# Patient Record
Sex: Male | Born: 1950 | Race: White | Hispanic: No | Marital: Married | Smoking: Former smoker
Health system: Southern US, Community
[De-identification: ages and names within clinical notes are randomized; demographics above are authoritative.]

## PROBLEM LIST (undated history)

## (undated) DIAGNOSIS — I10 Essential (primary) hypertension: Secondary | ICD-10-CM

## (undated) HISTORY — PX: VASECTOMY: SHX75

## (undated) HISTORY — PX: BASAL CELL CARCINOMA EXCISION: SHX1214

---

## 2005-07-04 ENCOUNTER — Ambulatory Visit: Payer: Self-pay

## 2005-07-13 ENCOUNTER — Ambulatory Visit: Payer: Self-pay | Admitting: Ophthalmology

## 2006-10-03 ENCOUNTER — Ambulatory Visit: Payer: Self-pay | Admitting: Gastroenterology

## 2014-06-19 DIAGNOSIS — Z85828 Personal history of other malignant neoplasm of skin: Secondary | ICD-10-CM

## 2014-06-19 HISTORY — DX: Personal history of other malignant neoplasm of skin: Z85.828

## 2014-08-21 DIAGNOSIS — Z85828 Personal history of other malignant neoplasm of skin: Secondary | ICD-10-CM | POA: Insufficient documentation

## 2014-09-02 DIAGNOSIS — I10 Essential (primary) hypertension: Secondary | ICD-10-CM

## 2014-09-02 HISTORY — DX: Essential (primary) hypertension: I10

## 2016-08-14 ENCOUNTER — Ambulatory Visit: Payer: Medicare Other

## 2016-08-14 ENCOUNTER — Ambulatory Visit
Admission: EM | Admit: 2016-08-14 | Discharge: 2016-08-14 | Disposition: A | Discharge status: Home / Self Care | Payer: Medicare Other | Attending: Family Medicine | Admitting: Family Medicine

## 2016-08-14 DIAGNOSIS — I10 Essential (primary) hypertension: Secondary | ICD-10-CM | POA: Insufficient documentation

## 2016-08-14 DIAGNOSIS — B9689 Other specified bacterial agents as the cause of diseases classified elsewhere: Secondary | ICD-10-CM | POA: Diagnosis not present

## 2016-08-14 DIAGNOSIS — M7021 Olecranon bursitis, right elbow: Secondary | ICD-10-CM | POA: Diagnosis not present

## 2016-08-14 DIAGNOSIS — M7989 Other specified soft tissue disorders: Secondary | ICD-10-CM | POA: Diagnosis present

## 2016-08-14 DIAGNOSIS — M71121 Other infective bursitis, right elbow: Secondary | ICD-10-CM

## 2016-08-14 HISTORY — DX: Essential (primary) hypertension: I10

## 2016-08-14 MED ORDER — MELOXICAM 15 MG PO TABS: 15.0000 mg | ORAL_TABLET | Freq: Every day | ORAL | 0 refills | Status: DC

## 2016-08-14 MED ORDER — SULFAMETHOXAZOLE-TRIMETHOPRIM 800-160 MG PO TABS: 1.0000 | ORAL_TABLET | Freq: Two times a day (BID) | ORAL | 0 refills | Status: DC

## 2016-08-14 NOTE — ED Triage Notes (Signed)
Patient presents with a swollen right elbow, that started Thursday. It only hurts when he puts pressure on it.

## 2016-08-14 NOTE — ED Provider Notes (Signed)
CSN: [id number]     Arrival date & time 08/14/16  [id number] History   First MD Initiated Contact with Patient 08/14/16 [phone]     Chief Complaint  Patient presents with  . Joint Swelling    Right   (Consider location/radiation/quality/duration/timing/severity/associated sxs/prior Treatment) HPI  This a 65 year old male who presents with a swollen right elbow that began to bother him approximately 3 days ago. 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 He has had no injury to the elbow that he is aware of. He does relate that he did tear off for shingles off a rental home does not remember having any injury specifically to the elbow. The and taken ibuprofen 800 mg 2 doses and has noticed an improvement still concerned with the swelling and heat that he feels in the elbow. He has no difficulty with range of motion. All of his pain seems to be occurring with any pressure that he places on the elbow itself.      Past Medical History:  Diagnosis Date  . Hypertension    Past Surgical History:  Procedure Laterality Date  . BASAL CELL CARCINOMA EXCISION    . VASECTOMY     Family History  Problem Relation Age of Onset  . Healthy Mother   . COPD Father    Social History  Substance Use Topics  . Smoking status: Former Research scientist (life sciences)  . Smokeless tobacco: Never Used  . Alcohol use No    Review of Systems  Constitutional: Positive for activity change. Negative for chills, fatigue and fever.  Musculoskeletal: Positive for arthralgias and joint swelling.  All other systems reviewed and are negative.   Allergies  Review of patient's allergies indicates no known allergies.  Home Medications   Prior to Admission medications   Medication Sig Start Date End Date Taking? Authorizing Provider  meloxicam (MOBIC) 15 MG tablet Take 1 tablet (15 mg total) by mouth daily. 08/14/16   Lorin Picket, PA-C  sulfamethoxazole-trimethoprim (BACTRIM DS,SEPTRA DS) 800-160 MG  tablet Take 1 tablet by mouth 2 (two) times daily. 08/14/16   Lorin Picket, PA-C   Meds Ordered and Administered this Visit  Medications - No data to display  BP 108/67 (BP Location: Left Arm)   Pulse 75   Temp 98 F (36.7 C) (Oral)   Resp 18   SpO2 97%  No data found.   Physical Exam  Constitutional: He is oriented to person, place, and time. He appears well-developed and well-nourished. No distress.  HENT:  Head: Normocephalic and atraumatic.  Eyes: EOM are normal. Pupils are equal, round, and reactive to light.  Neck: Normal range of motion. Neck supple.  Musculoskeletal: Normal range of motion. He exhibits edema and tenderness. He exhibits no deformity.  Examination of the right elbow shows it to be mildly swollen. There is some mild warmth over the olecranon. Age of motion shows forward full range of motion to flexion and extension pronation and supination. Maximum tenderness is over the olecranon itself. There is no induration or fluctuance noted.  Neurological: He is alert and oriented to person, place, and time.  Skin: Skin is warm and dry. He is not diaphoretic. There is erythema.  Psychiatric: He has a normal mood and affect. His behavior is normal. Judgment and thought content normal.  Nursing note and vitals reviewed.   Urgent Care Course   Clinical Course    Procedures (including critical care time)  Labs Review Labs Reviewed -  No data to display  Imaging Review Dg Elbow Complete Right  Result Date: 08/14/2016 CLINICAL DATA:  Right elbow swelling EXAM: RIGHT ELBOW - COMPLETE 3+ VIEW COMPARISON:  None. FINDINGS: Four views of the right elbow submitted. No acute fracture or subluxation. No joint effusion. There is soft tissue swelling dorsal elbow and distal humeral region highly suspicious for cellulitis. Clinical correlation is necessary. IMPRESSION: No acute fracture or subluxation. No joint effusion. There is soft tissue swelling dorsal elbow and distal  humeral region highly suspicious for cellulitis. Clinical correlation is necessary. Electronically Signed   By: Lahoma Crocker M.D.   On: 08/14/2016 10:35     Visual Acuity Review  Right Eye Distance:   Left Eye Distance:   Bilateral Distance:    Right Eye Near:   Left Eye Near:    Bilateral Near:         MDM   1. Septic olecranon bursitis of right elbow    Medications - No data to display Plan: 1. Test/x-ray results and diagnosis reviewed with patient 2. rx as per orders; risks, benefits, potential side effects reviewed with patient 3. Recommend supportive treatment with Strict avoidance of pressure. Ibuprofen and start taking Mobic. Follow up in the clinic primary care if not improving 4. F/u prn if symptoms worsen or don't improve    Lorin Picket, PA-C 08/14/16 1102

## 2017-10-18 IMAGING — CR DG ELBOW COMPLETE 3+V*R*
4 series · 4 of 4 positions shown · non-contrast
Comparison: None.

CLINICAL DATA: Right elbow swelling

EXAM:
RIGHT ELBOW - COMPLETE 3+ VIEW

[elbow ap]
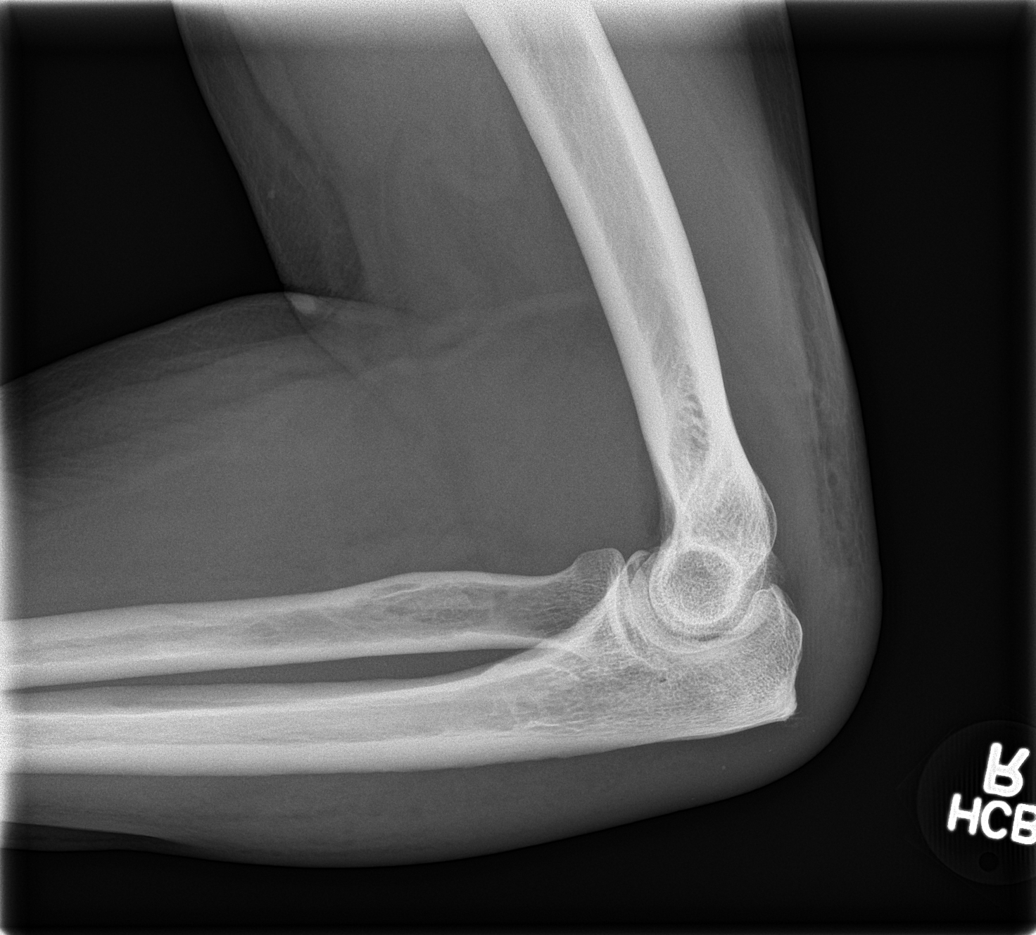

[elbow obl (1 of 2)]
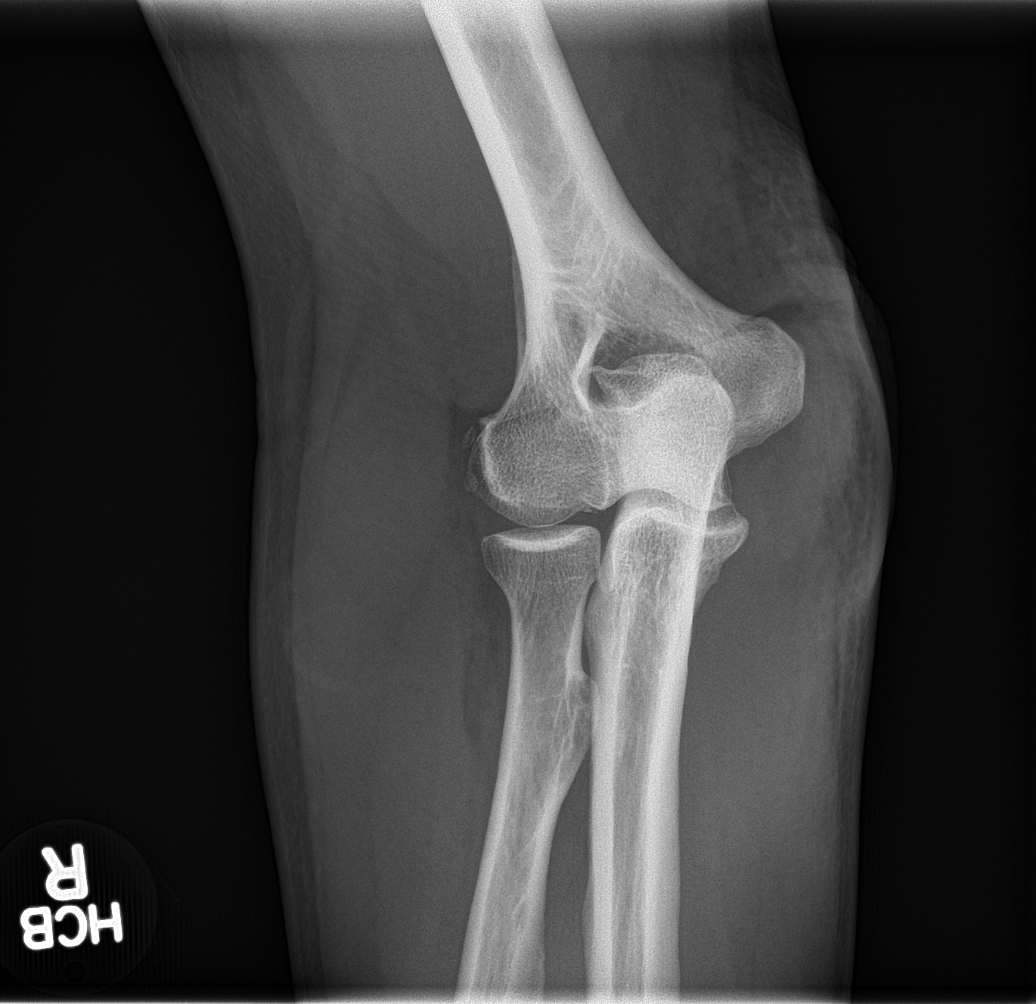

[elbow obl (2 of 2)]
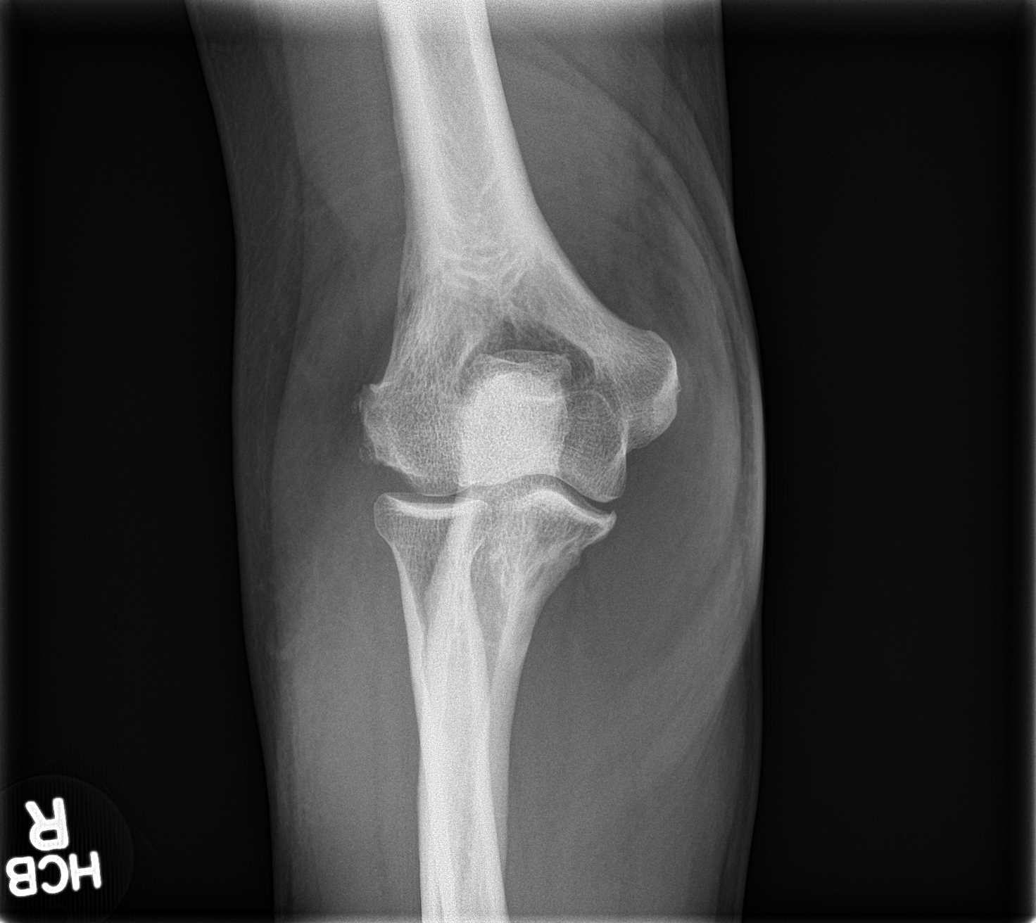

[elbow lat]
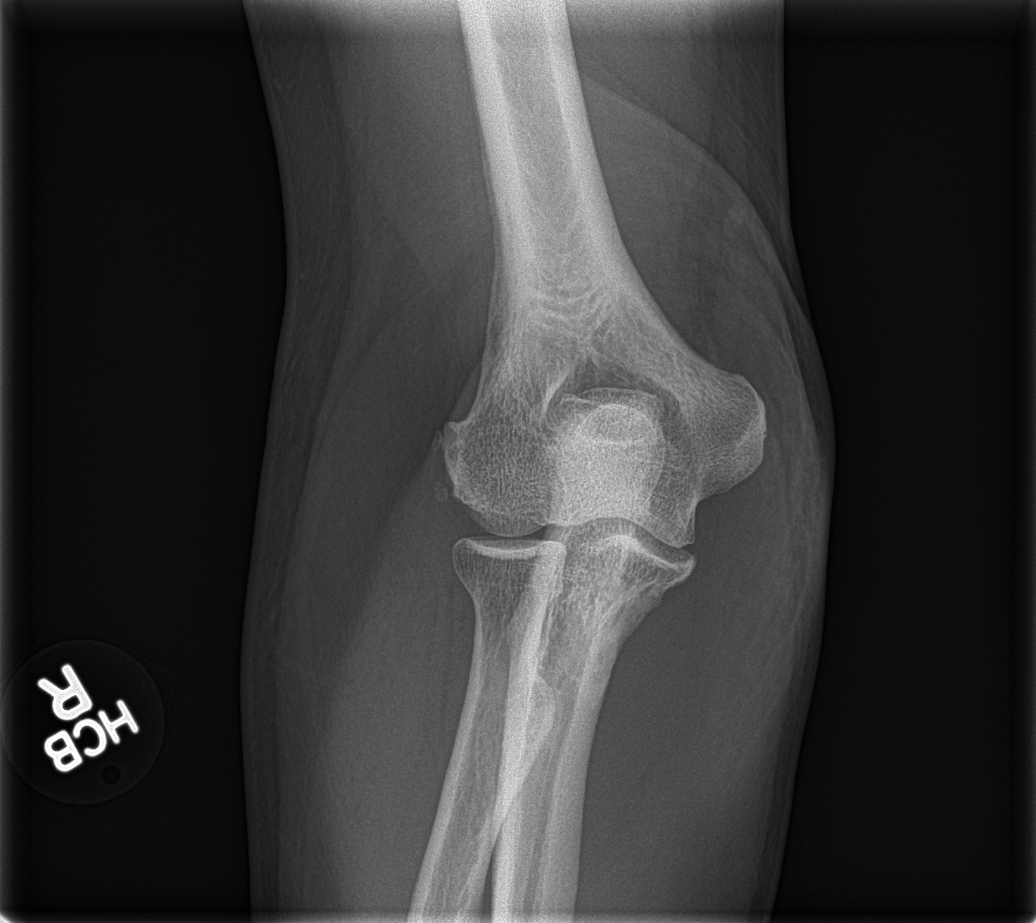

[4 of 4 positions shown; findings below may reference images not displayed]

FINDINGS: Four views of the right elbow submitted. No acute fracture or
subluxation. No joint effusion. There is soft tissue swelling dorsal
elbow and distal humeral region highly suspicious for cellulitis.
Clinical correlation is necessary.
IMPRESSION: No acute fracture or subluxation. No joint effusion. There is soft
tissue swelling dorsal elbow and distal humeral region highly
suspicious for cellulitis. Clinical correlation is necessary.

## 2017-10-19 DIAGNOSIS — R739 Hyperglycemia, unspecified: Secondary | ICD-10-CM | POA: Insufficient documentation

## 2017-10-19 HISTORY — DX: Hyperglycemia, unspecified: R73.9

## 2019-09-09 ENCOUNTER — Ambulatory Visit: Payer: BLUE CROSS/BLUE SHIELD

## 2019-09-23 ENCOUNTER — Other Ambulatory Visit: Payer: Self-pay | Admitting: *Deleted

## 2019-09-23 ENCOUNTER — Other Ambulatory Visit: Payer: Self-pay

## 2019-09-23 ENCOUNTER — Other Ambulatory Visit: Payer: Self-pay | Admitting: Urology

## 2019-09-23 DIAGNOSIS — Z125 Encounter for screening for malignant neoplasm of prostate: Secondary | ICD-10-CM

## 2019-09-23 NOTE — Progress Notes (Signed)
Patient: Darryl Mejia           Date of Birth: 05-16-1951           MRN: [id number] Visit Date: 09/23/2019 PCP: Patient, No Pcp Per     Prostate Exam Exam not completed.  Patient's History There are no active problems to display for this patient.  Past Medical History:  Diagnosis Date  . Hypertension     Family History  Problem Relation Age of Onset  . Healthy Mother   . COPD Father     Social History   Occupational History  . Not on file  Tobacco Use  . Smoking status: Former Research scientist (life sciences)  . Smokeless tobacco: Never Used  Substance and Sexual Activity  . Alcohol use: No  . Drug use: No  . Sexual activity: Not on file

## 2019-09-25 LAB — PSA: Prostate Specific Ag, Serum: 1.1 ng/mL (ref 0.0–4.0)

## 2019-10-02 ENCOUNTER — Telehealth (HOSPITAL_COMMUNITY): Payer: Self-pay | Admitting: *Deleted

## 2019-10-02 NOTE — Telephone Encounter (Signed)
Called patient and gave PSA result. Let him know it was 1.1 and normal for his age is below 3. Patient verbalized understanding.

## 2020-01-20 ENCOUNTER — Other Ambulatory Visit: Payer: Self-pay

## 2020-01-20 ENCOUNTER — Ambulatory Visit: Payer: Medicare Other | Attending: Internal Medicine

## 2020-01-20 DIAGNOSIS — Z23 Encounter for immunization: Secondary | ICD-10-CM | POA: Insufficient documentation

## 2020-01-20 NOTE — Progress Notes (Signed)
   Covid-19 Vaccination Clinic  Name:  Darryl Mejia    MRN: [id number] DOB: July 14, 1951  01/20/2020  [name] was observed post Covid-19 immunization for 15 minutes without incidence. He was provided with Vaccine Information Sheet and instruction to access the V-Safe system.   [name] was instructed to call 911 with any severe reactions post vaccine: Marland Kitchen Difficulty breathing  . Swelling of your face and throat  . A fast heartbeat  . A bad rash all over your body  . Dizziness and weakness    Immunizations Administered    Name Date Dose VIS Date Route   Pfizer COVID-19 Vaccine 01/20/2020  9:22 AM 0.3 mL 11/15/2019 Intramuscular   Manufacturer: [place]   Lot: [id number]   [place]: [id number]

## 2020-02-18 ENCOUNTER — Ambulatory Visit: Payer: Medicare Other | Attending: Internal Medicine

## 2020-02-18 DIAGNOSIS — Z23 Encounter for immunization: Secondary | ICD-10-CM

## 2020-02-18 NOTE — Progress Notes (Signed)
   Covid-19 Vaccination Clinic  Name:  Darryl Mejia    MRN: [id number] DOB: 1951/03/24  02/18/2020  [name] was observed post Covid-19 immunization for 15 minutes without incident. He was provided with Vaccine Information Sheet and instruction to access the V-Safe system.   [name] was instructed to call 911 with any severe reactions post vaccine: Marland Kitchen Difficulty breathing  . Swelling of face and throat  . A fast heartbeat  . A bad rash all over body  . Dizziness and weakness   Immunizations Administered    Name Date Dose VIS Date Route   Pfizer COVID-19 Vaccine 02/18/2020  8:49 AM 0.3 mL 11/15/2019 Intramuscular   Manufacturer: [place]   Lot: [id number]   [place]: [id number]

## 2020-03-18 ENCOUNTER — Other Ambulatory Visit: Payer: Self-pay

## 2020-03-18 ENCOUNTER — Encounter: Payer: Self-pay | Admitting: Dermatology

## 2020-03-18 ENCOUNTER — Ambulatory Visit (INDEPENDENT_AMBULATORY_CARE_PROVIDER_SITE_OTHER): Payer: Medicare Other | Admitting: Dermatology

## 2020-03-18 DIAGNOSIS — Z85828 Personal history of other malignant neoplasm of skin: Secondary | ICD-10-CM | POA: Diagnosis not present

## 2020-03-18 DIAGNOSIS — Z1283 Encounter for screening for malignant neoplasm of skin: Secondary | ICD-10-CM | POA: Diagnosis not present

## 2020-03-18 DIAGNOSIS — L578 Other skin changes due to chronic exposure to nonionizing radiation: Secondary | ICD-10-CM | POA: Diagnosis not present

## 2020-03-18 DIAGNOSIS — D229 Melanocytic nevi, unspecified: Secondary | ICD-10-CM

## 2020-03-18 DIAGNOSIS — L820 Inflamed seborrheic keratosis: Secondary | ICD-10-CM

## 2020-03-18 DIAGNOSIS — L821 Other seborrheic keratosis: Secondary | ICD-10-CM | POA: Diagnosis not present

## 2020-03-18 DIAGNOSIS — L570 Actinic keratosis: Secondary | ICD-10-CM

## 2020-03-18 NOTE — Progress Notes (Addendum)
Follow-Up Visit   Subjective  Darryl Mejia is a 69 y.o. male who presents for the following: check spot (R nose, present for >52yrs, no symptoms). He also has a spot on the chest that is growing.  He has also rough areas on the face of concern.  He would like other spots and moles checked.  The patient presents for upper body skin exam for skin cancer screening.  He has history of skin skin cancer of the right nose, basal cell carcinoma treated with Mohs surgery in the past.  The following portions of the chart were reviewed this encounter and updated as appropriate: Tobacco  Allergies  Meds  Problems  Med Hx  Surg Hx  Fam Hx      Review of Systems: No other skin or systemic complaints.  Objective  Well appearing patient in no apparent distress; mood and affect are within normal limits.  All skin waist up examined.  Objective  face (20): Pink scaly macules   Objective  R post medial shoulder, distal dorsum nose midline: Well healed scar with no evidence of recurrence.   Objective  R nasal bridge x 1, R nasal bridge lateral x 1, chest x 1 (3): Erythematous keratotic or waxy stuck-on papule or plaque.   Assessment & Plan   Actinic Damage - diffuse scaly erythematous macules with underlying dyspigmentation - Recommend daily broad spectrum sunscreen SPF 30+ to sun-exposed areas, reapply every 2 hours as needed.  - Call for new or changing lesions.  Skin cancer screening performed today.  0.2 Seborrheic Keratoses - Stuck-on, waxy, tan-brown papules and plaques  - Discussed benign etiology and prognosis. - Observe - Call for any changes  Melanocytic Nevi - Tan-brown and/or pink-flesh-colored symmetric macules and papules - Benign appearing on exam today - Observation - Call clinic for new or changing moles - Recommend daily use of broad spectrum spf 30+ sunscreen to sun-exposed areas.    AK (actinic keratosis) (20) face  Destruction of lesion -  face Complexity: simple   Destruction method: cryotherapy   Informed consent: discussed and consent obtained   Timeout:  patient name, date of birth, surgical site, and procedure verified Lesion destroyed using liquid nitrogen: Yes   Region frozen until ice ball extended beyond lesion: Yes   Outcome: patient tolerated procedure well with no complications   Post-procedure details: wound care instructions given    History of basal cell carcinoma (BCC) R post medial shoulder, distal dorsum nose midline  Clear. Observe for recurrence. Call clinic for new or changing lesions.  Recommend regular skin exams, daily broad-spectrum spf 30+ sunscreen use, and photoprotection.     Inflamed seborrheic keratosis (3) R nasal bridge x 1, R nasal bridge lateral x 1, chest x 1  Destruction of lesion - R nasal bridge x 1, R nasal bridge lateral x 1, chest x 1 Complexity: simple   Destruction method: cryotherapy   Informed consent: discussed and consent obtained   Timeout:  patient name, date of birth, surgical site, and procedure verified Lesion destroyed using liquid nitrogen: Yes   Region frozen until ice ball extended beyond lesion: Yes   Outcome: patient tolerated procedure well with no complications   Post-procedure details: wound care instructions given    Return in about 3 months (around 06/17/2020) for recheck ISKs R nasal bridge, R nasal bridge lateral.   I, Sonya Hupman, RMA, am acting as scribe for Sarina Ser, MD .  Documentation: I have reviewed the above documentation  for accuracy and completeness, and I agree with the above.  Sarina Ser, MD

## 2020-03-26 NOTE — Addendum Note (Signed)
Addended by: Nehemiah Massed, Hendy Brindle C on: 03/26/2020 01:00 PM   Modules accepted: Level of Service

## 2020-06-24 ENCOUNTER — Ambulatory Visit: Payer: BLUE CROSS/BLUE SHIELD | Admitting: Dermatology

## 2020-08-22 ENCOUNTER — Emergency Department (HOSPITAL_COMMUNITY)
Admission: EM | Admit: 2020-08-22 | Discharge: 2020-08-22 | Discharge status: Absent without leave | Payer: BLUE CROSS/BLUE SHIELD

## 2020-08-22 ENCOUNTER — Emergency Department
Admission: EM | Admit: 2020-08-22 | Discharge: 2020-08-22 | Disposition: A | Discharge status: Home / Self Care | Payer: Medicare Other | Attending: Emergency Medicine | Admitting: Emergency Medicine

## 2020-08-22 ENCOUNTER — Emergency Department: Payer: Medicare Other

## 2020-08-22 DIAGNOSIS — I10 Essential (primary) hypertension: Secondary | ICD-10-CM | POA: Diagnosis not present

## 2020-08-22 DIAGNOSIS — Z87891 Personal history of nicotine dependence: Secondary | ICD-10-CM | POA: Insufficient documentation

## 2020-08-22 DIAGNOSIS — Z79899 Other long term (current) drug therapy: Secondary | ICD-10-CM | POA: Insufficient documentation

## 2020-08-22 DIAGNOSIS — W5911XA Bitten by nonvenomous snake, initial encounter: Secondary | ICD-10-CM | POA: Diagnosis not present

## 2020-08-22 DIAGNOSIS — Z85828 Personal history of other malignant neoplasm of skin: Secondary | ICD-10-CM | POA: Insufficient documentation

## 2020-08-22 DIAGNOSIS — S61251A Open bite of left index finger without damage to nail, initial encounter: Secondary | ICD-10-CM | POA: Diagnosis present

## 2020-08-22 DIAGNOSIS — T63061A Toxic effect of venom of other North and South American snake, accidental (unintentional), initial encounter: Secondary | ICD-10-CM | POA: Diagnosis not present

## 2020-08-22 DIAGNOSIS — Y9389 Activity, other specified: Secondary | ICD-10-CM | POA: Diagnosis not present

## 2020-08-22 DIAGNOSIS — Z23 Encounter for immunization: Secondary | ICD-10-CM | POA: Insufficient documentation

## 2020-08-22 DIAGNOSIS — Y92017 Garden or yard in single-family (private) house as the place of occurrence of the external cause: Secondary | ICD-10-CM | POA: Insufficient documentation

## 2020-08-22 LAB — CBC WITH DIFFERENTIAL/PLATELET
Abs Immature Granulocytes: 0.03 10*3/uL (ref 0.00–0.07)
Basophils Absolute: 0.1 10*3/uL (ref 0.0–0.1)
Basophils Relative: 1 %
Eosinophils Absolute: 0.2 10*3/uL (ref 0.0–0.5)
Eosinophils Relative: 3 %
HCT: 39.9 % (ref 39.0–52.0)
Hemoglobin: 13.7 g/dL (ref 13.0–17.0)
Immature Granulocytes: 1 %
Lymphocytes Relative: 29 %
Lymphs Abs: 1.9 10*3/uL (ref 0.7–4.0)
MCH: 33.4 pg (ref 26.0–34.0)
MCHC: 34.3 g/dL (ref 30.0–36.0)
MCV: 97.3 fL (ref 80.0–100.0)
Monocytes Absolute: 0.6 10*3/uL (ref 0.1–1.0)
Monocytes Relative: 10 %
Neutro Abs: 3.8 10*3/uL (ref 1.7–7.7)
Neutrophils Relative %: 56 %
Platelets: 193 10*3/uL (ref 150–400)
RBC: 4.1 MIL/uL — ABNORMAL LOW (ref 4.22–5.81)
RDW: 12.2 % (ref 11.5–15.5)
WBC: 6.6 10*3/uL (ref 4.0–10.5)
nRBC: 0 % (ref 0.0–0.2)

## 2020-08-22 LAB — BASIC METABOLIC PANEL
Anion gap: 10 (ref 5–15)
BUN: 22 mg/dL (ref 8–23)
CO2: 25 mmol/L (ref 22–32)
Calcium: 9.2 mg/dL (ref 8.9–10.3)
Chloride: 104 mmol/L (ref 98–111)
Creatinine, Ser: 0.81 mg/dL (ref 0.61–1.24)
GFR calc Af Amer: >60 mL/min (ref >60)
GFR calc non Af Amer: >60 mL/min (ref >60)
Glucose, Bld: 128 mg/dL — ABNORMAL HIGH (ref 70–99)
Potassium: 3.9 mmol/L (ref 3.5–5.1)
Sodium: 139 mmol/L (ref 135–145)

## 2020-08-22 LAB — PROTIME-INR
INR: 1 (ref 0.8–1.2)
Prothrombin Time: 13.2 seconds (ref 11.4–15.2)

## 2020-08-22 LAB — FIBRINOGEN: Fibrinogen: 284 mg/dL (ref 210–475)

## 2020-08-22 MED ORDER — MORPHINE SULFATE (PF) 2 MG/ML IV SOLN: 2.0000 mg | Freq: Once | INTRAVENOUS | Status: DC

## 2020-08-22 MED ORDER — HYDROMORPHONE HCL 1 MG/ML IJ SOLN
0.5000 mg | Freq: Once | INTRAMUSCULAR | Status: AC
  Administered 2020-08-22: 0.5 mg via INTRAVENOUS
  Filled 2020-08-22: qty 1

## 2020-08-22 MED ORDER — TETANUS-DIPHTH-ACELL PERTUSSIS 5-2.5-18.5 LF-MCG/0.5 IM SUSP
0.5000 mL | Freq: Once | INTRAMUSCULAR | Status: AC
  Administered 2020-08-22: 0.5 mL via INTRAMUSCULAR
  Filled 2020-08-22: qty 0.5

## 2020-08-22 MED ORDER — ACETAMINOPHEN 500 MG PO TABS
1000.0000 mg | ORAL_TABLET | Freq: Once | ORAL | Status: AC
  Administered 2020-08-22: 1000 mg via ORAL
  Filled 2020-08-22: qty 2

## 2020-08-22 MED ORDER — SODIUM CHLORIDE 0.9 % IV SOLN: INTRAVENOUS | Status: DC

## 2020-08-22 NOTE — ED Notes (Signed)
Pt's left arm measured and mark with surgical tape and marker. Measurements are as follows:  Left Wrist 6 and 1/2 in Left Forearm 10 and 1/2in Left Bicep 11 and 1/2in

## 2020-08-22 NOTE — ED Notes (Addendum)
Swelling has extended from left index finger also to left knuckle. Pt denies pain. Arm remains straight and elevated. MD made aware

## 2020-08-22 NOTE — ED Notes (Addendum)
This RN on phone with poison control. Poison control recommendations: elevate extremity, keep arm straight, watch for pain not controlled with opioids and increased swelling even with elevation, observe at least 6hrs (when you do coags as well - PT/INR. Fibrinogen, platelets), recommend diliuadid over morphine for pain control, and consult poison control first if fasciotomy is needed.

## 2020-08-22 NOTE — ED Triage Notes (Signed)
Copperhead bite to the left index finger just PTA. Patient with swelling and bruising to same.

## 2020-08-22 NOTE — ED Provider Notes (Signed)
[facility] Emergency Department Provider Note  ____________________________________________   First MD Initiated Contact with Patient 08/22/20 1521     (approximate)  I have reviewed the triage vital signs and the nursing notes.   HISTORY  Chief Complaint Snake Bite    HPI Darryl Mejia is a 69 y.o. male , right hand dominant, was out his yeard today. Bent down to clear some brush and was bit by a copperhead on his left index finger, approx 45 min ago. Killed snake and knows it's a copperhead. Reports immediate onset of sharp, stabbing pain that has now spread to the thenar eminence of his thumb. Pain is only 1/10 currently.  he does feel like the swelling is advancing up his hand fairly frequently, though it is not blistering or painful. No numbness, tingling, paresthesias. No SOB. No chest pain. No other rash or areas of pain or swelling.     Past Medical History:  Diagnosis Date  . Hx of basal cell carcinoma 06/19/2014   Distal dorsum nose midline  . Hx of basal cell carcinoma 07/19/2018   R post medial shoulder  . Hypertension     There are no problems to display for this patient.   Past Surgical History:  Procedure Laterality Date  . BASAL CELL CARCINOMA EXCISION    . VASECTOMY      Prior to Admission medications   Medication Sig Start Date End Date Taking? Authorizing Provider  aspirin 81 MG EC tablet Take 81 mg by mouth daily.   Yes [provider]  atenolol (TENORMIN) 50 MG tablet Take 50 mg by mouth daily. 06/26/20  Yes [provider]    Allergies Patient has no known allergies.  Family History  Problem Relation Age of Onset  . Healthy Mother   . COPD Father     Social History Social History   Tobacco Use  . Smoking status: Former Research scientist (life sciences)  . Smokeless tobacco: Never Used  Substance Use Topics  . Alcohol use: No  . Drug use: No    Review of Systems  Review of Systems  Constitutional: Negative for  chills and fever.  HENT: Negative for sore throat.   Respiratory: Negative for shortness of breath.   Cardiovascular: Negative for chest pain.  Gastrointestinal: Negative for abdominal pain.  Genitourinary: Negative for flank pain.  Musculoskeletal: Negative for neck pain.  Skin: Positive for rash and wound.  Allergic/Immunologic: Negative for immunocompromised state.  Neurological: Negative for weakness and numbness.  Hematological: Does not bruise/bleed easily.  All other systems reviewed and are negative.    ____________________________________________   PHYSICAL EXAM:      VITAL SIGNS: ED Triage Vitals  Enc Vitals Group     BP 08/22/20 1516 (!) 145/86     Pulse Rate 08/22/20 1516 94     Resp 08/22/20 1516 18     Temp 08/22/20 1516 98.6 F (37 C)     Temp Source 08/22/20 1516 Oral     SpO2 08/22/20 1516 95 %     Weight 08/22/20 1518 181 lb (82.1 kg)     Height 08/22/20 1518 5\' 9"  (1.753 m)     Head Circumference --      Peak Flow --      Pain Score 08/22/20 1518 2     Pain Loc --      Pain Edu? --      Excl. in [place]? --      Physical Exam Vitals and  nursing note reviewed.  Constitutional:      General: He is not in acute distress.    Appearance: He is well-developed.  HENT:     Head: Normocephalic and atraumatic.  Eyes:     Conjunctiva/sclera: Conjunctivae normal.  Cardiovascular:     Rate and Rhythm: Normal rate and regular rhythm.     Heart sounds: Normal heart sounds.  Pulmonary:     Effort: Pulmonary effort is normal. No respiratory distress.     Breath sounds: No wheezing.  Abdominal:     General: There is no distension.  Musculoskeletal:     Cervical back: Neck supple.  Skin:    General: Skin is warm.     Capillary Refill: Capillary refill takes less than 2 seconds.     Findings: No rash.  Neurological:     Mental Status: He is alert and oriented to person, place, and time.     Motor: No abnormal muscle tone.      UPPER EXTREMITY EXAM:  LEFT  INSPECTION & PALPATION: Punctate single bite mark noted along volar aspect of distal phalanx of left index finger. There is moderate soft tissue edema w/o blistering or skin changes extending to the MCP, worse on dorsal aspect of hand. Passive ROM is intact and minimally painful.  SENSORY: Sensation is intact to light touch in:  Superficial radial nerve distribution (dorsal first web space) Median nerve distribution (tip of index finger)   Ulnar nerve distribution (tip of small finger)     MOTOR:  + Motor posterior interosseous nerve (thumb IP extension) + Anterior interosseous nerve (thumb IP flexion, index finger DIP flexion) + Radial nerve (wrist extension) + Median nerve (palpable firing thenar mass) + Ulnar nerve (palpable firing of first dorsal interosseous muscle)  VASCULAR: 2+ radial pulse Brisk capillary refill < 2 sec, fingers warm and well-perfused  COMPARTMENTS: Soft, warm, well-perfused No pain with passive extension No paresthesias     ____________________________________________   LABS (all labs ordered are listed, but only abnormal results are displayed)  Labs Reviewed  BASIC METABOLIC PANEL - Abnormal; Notable for the following components:      Result Value   Glucose, Bld 128 (*)    All other components within normal limits  CBC WITH DIFFERENTIAL/PLATELET - Abnormal; Notable for the following components:   RBC 4.10 (*)    All other components within normal limits  PROTIME-INR  FIBRINOGEN  CBC WITH DIFFERENTIAL/PLATELET  CBC WITH DIFFERENTIAL/PLATELET  PROTIME-INR  PROTIME-INR  FIBRINOGEN  FIBRINOGEN    ____________________________________________  EKG: None ________________________________________  RADIOLOGY All imaging, including plain films, CT scans, and ultrasounds, independently reviewed by me, and interpretations confirmed via formal radiology reads.  ED MD interpretation:   XR Finger left index: no acute  abnormality  Official radiology report(s): DG Finger Index Left  Result Date: 08/22/2020 CLINICAL DATA:  Snake bite EXAM: LEFT INDEX FINGER 2+V COMPARISON:  None. FINDINGS: There is no evidence of fracture or dislocation. There is no evidence of arthropathy or other focal bone abnormality. Diffuse soft tissue swelling seen around the digit. IMPRESSION: No acute osseous abnormality.  Diffuse soft tissue swelling. Electronically Signed   By: Prudencio Pair M.D.   On: 08/22/2020 16:41    ____________________________________________  PROCEDURES   Procedure(s) performed (including Critical Care):  Procedures  ____________________________________________  INITIAL IMPRESSION / MDM / [place] / ED COURSE  As part of my medical decision making, I reviewed the following data within the [place]  notes reviewed and incorporated, Old chart reviewed, Notes from prior ED visits, and [place] Controlled Substance Database       *Darryl Mejia was evaluated in Emergency Department on 08/22/2020 for the symptoms described in the history of present illness. He was evaluated in the context of the global COVID-19 pandemic, which necessitated consideration that the patient might be at risk for infection with the SARS-CoV-2 virus that causes COVID-19. Institutional protocols and algorithms that pertain to the evaluation of patients at risk for COVID-19 are in a state of rapid change based on information released by regulatory bodies including the CDC and federal and state organizations. These policies and algorithms were followed during the patient's care in the ED.  Some ED evaluations and interventions may be delayed as a result of limited staffing during the pandemic.*     Medical Decision Making:  69 yo M here with copperhead bite to left index finger. No retained FB. Labs reassuring with no signs of DIC or systemic involvement. Pt initially with mild to mod edema but this  regressed with elevation over a 6 hr obs period. <2 score on severity scale and pt does not meet criteria for antivenin, and pain is markedly well controlled. Will d/c with elevation, supportive care, good retur precautions.  ____________________________________________  FINAL CLINICAL IMPRESSION(S) / ED DIAGNOSES  Final diagnoses:  Snake bite, initial encounter     MEDICATIONS GIVEN DURING THIS VISIT:  Medications  0.9 %  sodium chloride infusion ( Intravenous New Bag/Given 08/22/20 1601)  Tdap (BOOSTRIX) injection 0.5 mL (0.5 mLs Intramuscular Given 08/22/20 1602)  acetaminophen (TYLENOL) tablet 1,000 mg (1,000 mg Oral Given 08/22/20 1624)  HYDROmorphone (DILAUDID) injection 0.5 mg (0.5 mg Intravenous Given 08/22/20 1625)     ED Discharge Orders    None       Note:  This document was prepared using Dragon voice recognition software and may include unintentional dictation errors.   Duffy Bruce, MD 08/22/20 2051

## 2020-08-22 NOTE — Discharge Instructions (Addendum)
Keep the finger/extremity elevated tonight  Take tylenol and/or advil as needed for pain

## 2020-08-22 NOTE — ED Notes (Signed)
Per poison control recommendations pt's arm elevated straight onto bedside table with pillow

## 2020-08-23 NOTE — ED Notes (Signed)
Poison control called to follow up. RN answered poison control questions.

## 2020-09-09 ENCOUNTER — Telehealth: Payer: Self-pay | Admitting: Nurse Practitioner

## 2020-09-09 DIAGNOSIS — U071 COVID-19: Secondary | ICD-10-CM

## 2020-09-09 NOTE — Telephone Encounter (Signed)
Called to discuss with Jamesetta Orleans about Covid symptoms and the use of regeneron, a monoclonal antibody infusion for those with mild to moderate Covid symptoms and at a high risk of hospitalization.     Pt is qualified for this infusion at the [place] infusion center due to co-morbid conditions and/or a member of an at-risk group, however declines infusion at this time. He is currently 10 days from symptom onset and feels well with mild lingering symptoms. Says he feels like he could play golf. Symptoms tier reviewed as well as criteria for ending isolation.  Symptoms reviewed that would warrant ED/Hospital evaluation. Preventative practices reviewed. Patient verbalized understanding. Patient advised to call back if he/she opts to proceed with infusion. Callback number provided. Urgent care and/or ER precautions given for severe symptoms. Last date eligible for infusion: 09/09/20. Also discussed booster recommendation, timing of booster, and antibodies from natural infection vs vaccination.    There are no problems to display for this patient.   Beckey Rutter, NP [phone] [email or web address]

## 2020-11-12 DIAGNOSIS — N138 Other obstructive and reflux uropathy: Secondary | ICD-10-CM

## 2020-11-12 HISTORY — DX: Other obstructive and reflux uropathy: N13.8

## 2020-12-07 ENCOUNTER — Ambulatory Visit: Payer: Medicare Other | Attending: Internal Medicine

## 2020-12-07 DIAGNOSIS — Z23 Encounter for immunization: Secondary | ICD-10-CM

## 2020-12-07 NOTE — Progress Notes (Signed)
   Covid-19 Vaccination Clinic  Name:  Darryl Mejia    MRN: [id number] DOB: [birth year]  12/07/2020  Mr. Darryl Mejia was observed post Covid-19 immunization for 15 minutes without incident. He was provided with Vaccine Information Sheet and instruction to access the V-Safe system.   Mr. Darryl Mejia was instructed to call 911 with any severe reactions post vaccine: Marland Kitchen Difficulty breathing  . Swelling of face and throat  . A fast heartbeat  . A bad rash all over body  . Dizziness and weakness   Immunizations Administered    Name Date Dose VIS Date Route   Pfizer COVID-19 Vaccine 12/07/2020  1:49 PM 0.3 mL 09/23/2020 Intramuscular   Manufacturer: [employer], [employer]   Lot: [id number]   [place]: [address]-3

## 2021-03-11 ENCOUNTER — Ambulatory Visit: Payer: BLUE CROSS/BLUE SHIELD

## 2021-10-26 IMAGING — DX DG FINGER INDEX 2+V*L*
3 series · 3 of 3 positions shown · non-contrast
Comparison: None.

CLINICAL DATA: Snake bite

EXAM:
LEFT INDEX FINGER 2+V

[finger ap]
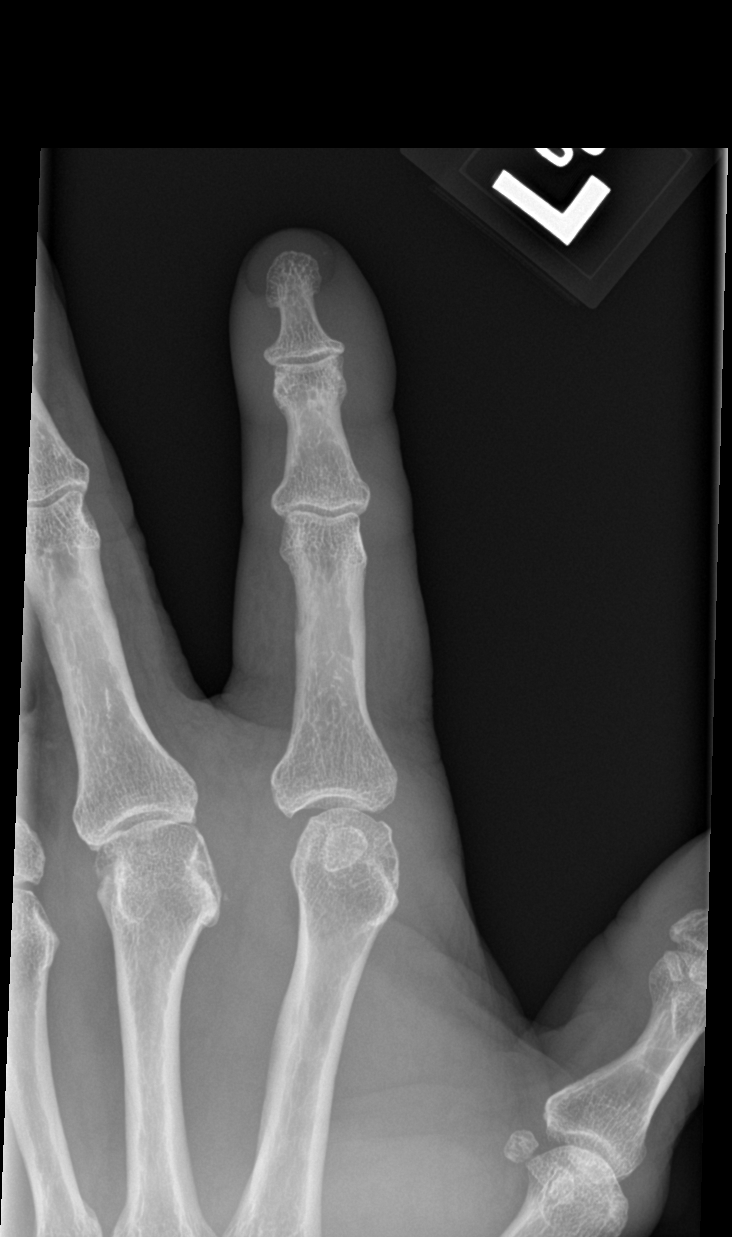

[finger obl]
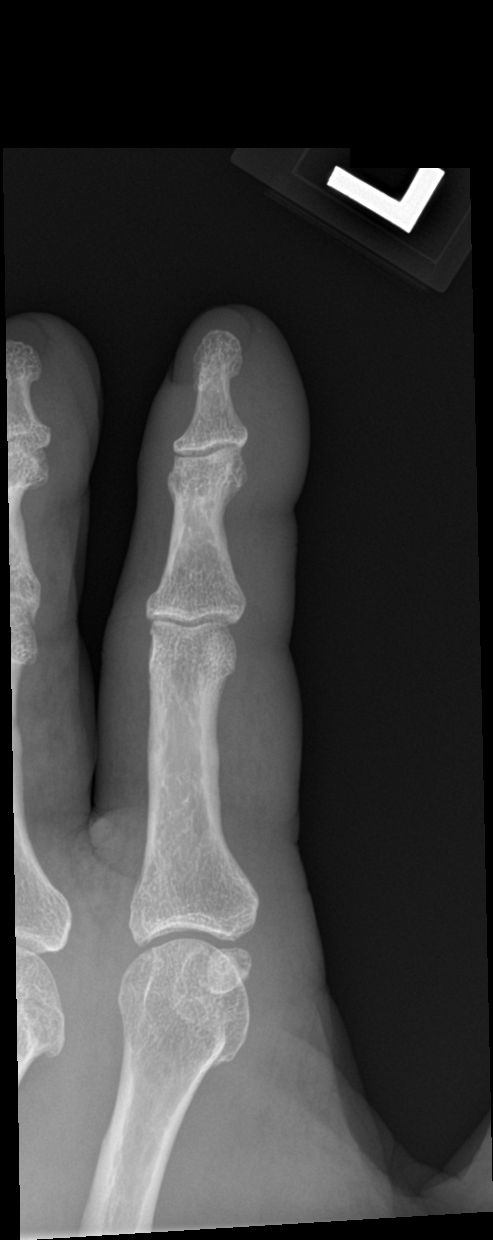

[finger lat]
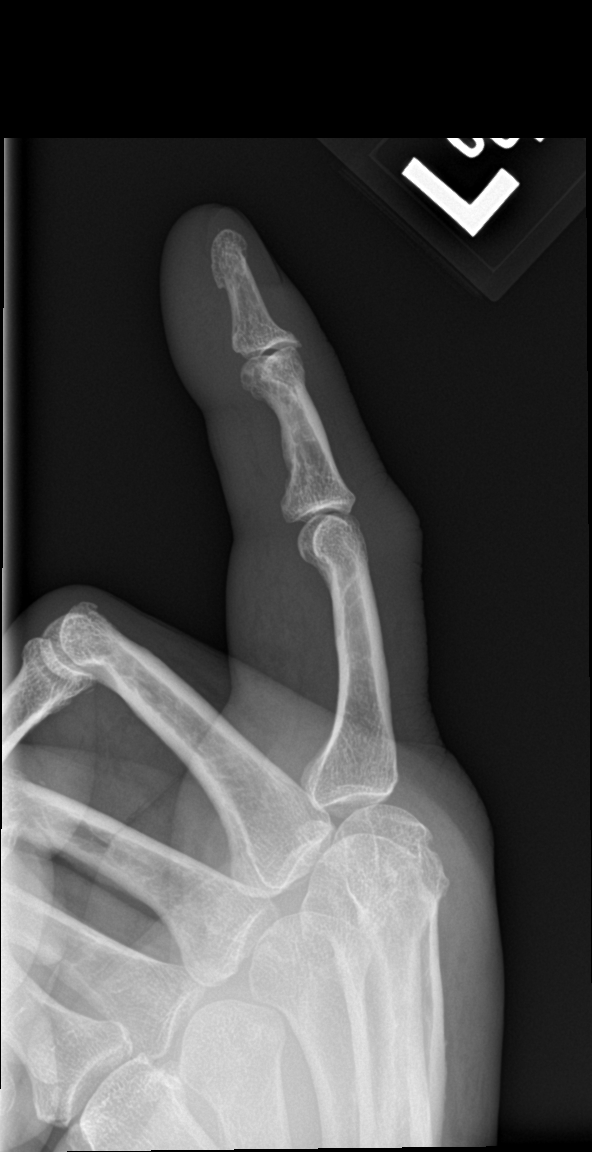

[3 of 3 positions shown; findings below may reference images not displayed]

FINDINGS: There is no evidence of fracture or dislocation. There is no
evidence of arthropathy or other focal bone abnormality. Diffuse
soft tissue swelling seen around the digit.
IMPRESSION: No acute osseous abnormality.  Diffuse soft tissue swelling.

## 2022-03-03 ENCOUNTER — Ambulatory Visit (INDEPENDENT_AMBULATORY_CARE_PROVIDER_SITE_OTHER): Payer: Medicare Other | Admitting: Urology

## 2022-03-03 ENCOUNTER — Encounter: Payer: Self-pay | Admitting: Urology

## 2022-03-03 VITALS — BP 136/81 | HR 76 | Ht 69.0 in | Wt 190.0 lb

## 2022-03-03 DIAGNOSIS — N401 Enlarged prostate with lower urinary tract symptoms: Secondary | ICD-10-CM | POA: Diagnosis not present

## 2022-03-03 DIAGNOSIS — N390 Urinary tract infection, site not specified: Secondary | ICD-10-CM

## 2022-03-03 DIAGNOSIS — N410 Acute prostatitis: Secondary | ICD-10-CM | POA: Diagnosis not present

## 2022-03-03 DIAGNOSIS — N138 Other obstructive and reflux uropathy: Secondary | ICD-10-CM

## 2022-03-03 LAB — MICROSCOPIC EXAMINATION
Bacteria, UA: NONE SEEN
Epithelial Cells (non renal): NONE SEEN /hpf (ref 0–10)
RBC, Urine: NONE SEEN /hpf (ref 0–2)

## 2022-03-03 LAB — URINALYSIS, COMPLETE
Bilirubin, UA: NEGATIVE
Glucose, UA: NEGATIVE
Ketones, UA: NEGATIVE
Leukocytes,UA: NEGATIVE
Nitrite, UA: NEGATIVE
Protein,UA: NEGATIVE
RBC, UA: NEGATIVE
Specific Gravity, UA: 1.025 (ref 1.005–1.030)
Urobilinogen, Ur: 0.2 mg/dL (ref 0.2–1.0)
pH, UA: 5.5 (ref 5.0–7.5)

## 2022-03-03 LAB — BLADDER SCAN AMB NON-IMAGING

## 2022-03-03 MED ORDER — CEFDINIR 300 MG PO CAPS: 300.0000 mg | ORAL_CAPSULE | Freq: Two times a day (BID) | ORAL | 0 refills | Status: DC

## 2022-03-03 MED ORDER — TAMSULOSIN HCL 0.4 MG PO CAPS: 0.4000 mg | ORAL_CAPSULE | Freq: Every day | ORAL | 11 refills | Status: DC

## 2022-03-03 NOTE — Patient Instructions (Addendum)
Cystoscopy ?Cystoscopy is a procedure that is used to help diagnose and sometimes treat conditions that affect the lower urinary tract. The lower urinary tract includes the bladder and the urethra. The urethra is the tube that drains urine from the bladder. Cystoscopy is done using a thin, tube-shaped instrument with a light and camera at the end (cystoscope). The cystoscope may be hard or flexible, depending on the goal of the procedure. The cystoscope is inserted through the urethra, into the bladder. ?Cystoscopy may be recommended if you have: ?Urinary tract infections that keep coming back. ?Blood in the urine (hematuria). ?An inability to control when you urinate (urinary incontinence) or an overactive bladder. ?Unusual cells found in a urine sample. ?A blockage in the urethra, such as a urinary stone. ?Painful urination. ?An abnormality in the bladder found during an intravenous pyelogram (IVP) or CT scan. ?What are the risks? ?Generally, this is a safe procedure. However, problems may occur, including: ?Infection. ?Bleeding. ? ?What happens during the procedure? ? ?You will be given one or more of the following: ?A medicine to numb the area (local anesthetic). ?The area around the opening of your urethra will be cleaned. ?The cystoscope will be passed through your urethra into your bladder. ?Germ-free (sterile) fluid will flow through the cystoscope to fill your bladder. The fluid will stretch your bladder so that your health care provider can clearly examine your bladder walls. ?Your doctor will look at the urethra and bladder. ?The cystoscope will be removed ?The procedure may vary among health care providers  ?What can I expect after the procedure? ?After the procedure, it is common to have: ?Some soreness or pain in your urethra. ?Urinary symptoms. These include: ?Mild pain or burning when you urinate. Pain should stop within a few minutes after you urinate. This may last for up to a few days after the  procedure. ?A small amount of blood in your urine for several days. ?Feeling like you need to urinate but producing only a small amount of urine. ?Follow these instructions at home: ?General instructions ?Return to your normal activities as told by your health care provider.  ?Drink plenty of fluids after the procedure. ?Keep all follow-up visits as told by your health care provider. This is important. ?Contact a health care provider if you: ?Have pain that gets worse or does not get better with medicine, especially pain when you urinate lasting longer than 72 hours after the procedure. ?Have trouble urinating. ?Get help right away if you: ?Have blood clots in your urine. ?Have a fever or chills. ?Are unable to urinate. ?Summary ?Cystoscopy is a procedure that is used to help diagnose and sometimes treat conditions that affect the lower urinary tract. ?Cystoscopy is done using a thin, tube-shaped instrument with a light and camera at the end. ?After the procedure, it is common to have some soreness or pain in your urethra. ?It is normal to have blood in your urine after the procedure.  ?If you were prescribed an antibiotic medicine, take it as told by your health care provider.  ?This information is not intended to replace advice given to you by your health care provider. Make sure you discuss any questions you have with your health care provider. ?Document Revised: 11/13/2018 Document Reviewed: 11/13/2018 ?Elsevier Patient Education ? [place]. ? ? ?Benign Prostatic Hyperplasia ?Benign prostatic hyperplasia (BPH) is an enlarged prostate gland that is caused by the normal aging process. The prostate may get bigger as a man gets older. The  condition is not caused by cancer. The prostate is a walnut-sized gland that is involved in the production of semen. It is located in front of the rectum and below the bladder. The bladder stores urine. The urethra carries stored urine out of the body. ?An enlarged  prostate can press on the urethra. This can make it harder to pass urine. The buildup of urine in the bladder can cause infection. Back pressure and infection may progress to bladder damage and kidney (renal) failure. ?What are the causes? ?This condition is part of the normal aging process. However, not all men develop problems from this condition. If the prostate enlarges away from the urethra, urine flow will not be blocked. If it enlarges toward the urethra and compresses it, there will be problems passing urine. ?What increases the risk? ?This condition is more likely to develop in men older than 50 years. ?What are the signs or symptoms? ?Symptoms of this condition include: ?Getting up often during the night to urinate. ?Needing to urinate frequently during the day. ?Difficulty starting urine flow. ?Decrease in size and strength of your urine stream. ?Leaking (dribbling) after urinating. ?Inability to pass urine. This needs immediate treatment. ?Inability to completely empty your bladder. ?Pain when you pass urine. This is more common if there is also an infection. ?Urinary tract infection (UTI). ?How is this diagnosed? ?This condition is diagnosed based on your medical history, a physical exam, and your symptoms. Tests will also be done, such as: ?A post-void bladder scan. This measures any amount of urine that may remain in your bladder after you finish urinating. ?A digital rectal exam. In a rectal exam, your health care provider checks your prostate by putting a lubricated, gloved finger into your rectum to feel the back of your prostate gland. This exam detects the size of your gland and any abnormal lumps or growths. ?An exam of your urine (urinalysis). ?A prostate specific antigen (PSA) screening. This is a blood test used to screen for prostate cancer. ?An ultrasound. This test uses sound waves to electronically produce a picture of your prostate gland. ?Your health care provider may refer you to a  specialist in kidney and prostate diseases (urologist). ?How is this treated? ?Once symptoms begin, your health care provider will monitor your condition (active surveillance or watchful waiting). Treatment for this condition will depend on the severity of your condition. Treatment may include: ?Observation and yearly exams. This may be the only treatment needed if your condition and symptoms are mild. ?Medicines to relieve your symptoms, including: ?Medicines to shrink the prostate. ?Medicines to relax the muscle of the prostate. ?Surgery in severe cases. Surgery may include: ?Prostatectomy. In this procedure, the prostate tissue is removed completely through an open incision or with a laparoscope or robotics. ?Transurethral resection of the prostate (TURP). In this procedure, a tool is inserted through the opening at the tip of the penis (urethra). It is used to cut away tissue of the inner core of the prostate. The pieces are removed through the same opening of the penis. This removes the blockage. ?Transurethral incision (TUIP). In this procedure, small cuts are made in the prostate. This lessens the prostate's pressure on the urethra. ?Transurethral microwave thermotherapy (TUMT). This procedure uses microwaves to create heat. The heat destroys and removes a small amount of prostate tissue. ?Transurethral needle ablation (TUNA). This procedure uses radio frequencies to destroy and remove a small amount of prostate tissue. ?Interstitial laser coagulation ([place]). This procedure uses a laser to  destroy and remove a small amount of prostate tissue. ?Transurethral electrovaporization (TUVP). This procedure uses electrodes to destroy and remove a small amount of prostate tissue. ?Prostatic urethral lift. This procedure inserts an implant to push the lobes of the prostate away from the urethra. ?Follow these instructions at home: ?Take over-the-counter and prescription medicines only as told by your health care  provider. ?Monitor your symptoms for any changes. Contact your health care provider with any changes. ?Avoid drinking large amounts of liquid before going to bed or out in public. ?Avoid or reduce how much caffeine or a

## 2022-03-03 NOTE — Progress Notes (Signed)
? ?  03/03/22 ?4:31 PM  ? ?Darryl Mejia ?1951/05/03 ?[id number] ? ?CC: Recurrent UTIs, BPH and incomplete bladder emptying ? ?HPI: ?I saw Darryl Mejia today for the above issues.  He is a 71 year old male who developed a UTI in November 2022 and was treated with a 1 week course of antibiotics with resolution of his symptoms.  He has had multiple culture documented recurrent Staph aureus UTIs since that time, and each time he has discontinued antibiotics when he feels his symptoms improved, never completed a complete course.  He was also evaluated by Dr. Eliberto Ivory locally and PVR was significantly elevated at 400 mL and he was started on Flomax.  He apparently had a IVP there as well that was reportedly normal.  DRE was normal with Dr. Eliberto Ivory showing a 30 g gland, and PSA was normal in December 2022 at 0.78. ? ?He reports some increased urinary frequency and some weak stream, but this has improved somewhat on Flomax.  PVR today remains elevated at 209 mL.  He is currently on a course of cefdinir for most recent UTI, as he discontinued the Levaquin secondary to joint pain. ? ? ?PMH: ?Past Medical History:  ?Diagnosis Date  ? Hx of basal cell carcinoma 06/19/2014  ? Distal dorsum nose midline  ? Hx of basal cell carcinoma 07/19/2018  ? R post medial shoulder  ? Hypertension   ? ? ?Surgical History: ?Past Surgical History:  ?Procedure Laterality Date  ? BASAL CELL CARCINOMA EXCISION    ? VASECTOMY    ? ? ?Family History: ?Family History  ?Problem Relation Age of Onset  ? Healthy Mother   ? COPD Father   ? ? ?Social History:  reports that he has quit smoking. He has been exposed to tobacco smoke. He has never used smokeless tobacco. He reports that he does not drink alcohol and does not use drugs. ? ?Physical Exam: ?BP 136/81   Pulse 76   Ht '5\' 9"'$  (1.753 m)   Wt 190 lb (86.2 kg)   BMI 28.06 kg/m?   ? ?Constitutional:  Alert and oriented, No acute distress. ?Cardiovascular: No clubbing, cyanosis, or edema. ?Respiratory: Normal  respiratory effort, no increased work of breathing. ?GI: Abdomen is soft, nontender, nondistended, no abdominal masses ? ?Laboratory Data: ?Reviewed, see HPI ? ?Pertinent Imaging: ?Unable to review, but IVP with Dr. Eliberto Ivory reportedly normal ? 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  First, I recommended completing a full 3-week course of the cefdinir for his suspected prostatitis which may improve his recurrent infections.  I also recommended a cystoscopy for further evaluation of urethral stricture or BPH as well as any bladder lesions.  He likely would benefit from an outlet procedure with his recurrent infections and incomplete bladder emptying. ? ?Cefdinir extended to 3-week course total for prostatitis ?RTC 1 month for cystoscopy ? ?Nickolas Madrid, MD ?03/03/2022 ? ?[facility] [place] ?[address] ?[place], [place] [address] ?[phone] ? ? ?

## 2022-03-31 ENCOUNTER — Ambulatory Visit (INDEPENDENT_AMBULATORY_CARE_PROVIDER_SITE_OTHER): Payer: Medicare Other | Admitting: Urology

## 2022-03-31 ENCOUNTER — Encounter: Payer: Self-pay | Admitting: Urology

## 2022-03-31 VITALS — BP 121/82 | HR 51 | Ht 69.0 in | Wt 187.0 lb

## 2022-03-31 DIAGNOSIS — R3914 Feeling of incomplete bladder emptying: Secondary | ICD-10-CM

## 2022-03-31 DIAGNOSIS — N138 Other obstructive and reflux uropathy: Secondary | ICD-10-CM

## 2022-03-31 DIAGNOSIS — N401 Enlarged prostate with lower urinary tract symptoms: Secondary | ICD-10-CM | POA: Diagnosis not present

## 2022-03-31 DIAGNOSIS — R3912 Poor urinary stream: Secondary | ICD-10-CM | POA: Diagnosis not present

## 2022-03-31 DIAGNOSIS — Z87440 Personal history of urinary (tract) infections: Secondary | ICD-10-CM

## 2022-03-31 DIAGNOSIS — R350 Frequency of micturition: Secondary | ICD-10-CM

## 2022-03-31 DIAGNOSIS — N390 Urinary tract infection, site not specified: Secondary | ICD-10-CM

## 2022-03-31 DIAGNOSIS — R351 Nocturia: Secondary | ICD-10-CM

## 2022-03-31 MED ORDER — SULFAMETHOXAZOLE-TRIMETHOPRIM 800-160 MG PO TABS
1.0000 | ORAL_TABLET | Freq: Once | ORAL | Status: AC
  Administered 2022-03-31: 1 via ORAL

## 2022-03-31 NOTE — Addendum Note (Signed)
Addended by: Despina Hidden on: 03/31/2022 03:57 PM ? ? Modules accepted: Orders ? ?

## 2022-03-31 NOTE — Patient Instructions (Signed)

## 2022-03-31 NOTE — Progress Notes (Signed)
Cystoscopy Procedure Note: ? ?Indication: BPH, incomplete bladder emptying, recurrent UTI ? ?Bactrim given for prophylaxis, UA completely benign today ? ?Primary urinary symptoms are frequency and some weak stream, nocturia 1 time per night ? ?After informed consent and discussion of the procedure and its risks, Darryl Mejia was positioned and prepped in the standard fashion. Cystoscopy was performed with a flexible cystoscope. The urethra, bladder neck and entire bladder was visualized in a standard fashion. The prostate was moderate in size with lateral lobe hypertrophy. The ureteral orifices were visualized in their normal location and orientation.  Small bladder diverticula, no suspicious lesions, no abnormalities on retroflexion. ? ?Findings: ?Moderate prostate enlargement, no suspicious lesions, no stricture ? ?Assessment and Plan: ?He would like to continue Flomax for his BPH.  We discussed return precautions extensively including worsening urinary symptoms, recurrent UTIs, or urinary retention.  He would be a good candidate for UroLift in the future if worsening symptoms and he opts for intervention ? ?RTC 6 months PVR, sooner if problems ? ?Nickolas Madrid, MD ?03/31/2022  ? ?

## 2022-04-01 LAB — URINALYSIS, COMPLETE
Bilirubin, UA: NEGATIVE
Glucose, UA: NEGATIVE
Ketones, UA: NEGATIVE
Leukocytes,UA: NEGATIVE
Nitrite, UA: NEGATIVE
Protein,UA: NEGATIVE
Specific Gravity, UA: 1.02 (ref 1.005–1.030)
Urobilinogen, Ur: 0.2 mg/dL (ref 0.2–1.0)
pH, UA: 5.5 (ref 5.0–7.5)

## 2022-04-01 LAB — MICROSCOPIC EXAMINATION
Bacteria, UA: NONE SEEN
RBC, Urine: NONE SEEN /hpf (ref 0–2)

## 2022-05-19 DIAGNOSIS — R7303 Prediabetes: Secondary | ICD-10-CM

## 2022-05-19 HISTORY — DX: Prediabetes: R73.03

## 2022-09-29 ENCOUNTER — Ambulatory Visit: Payer: Medicare Other | Admitting: Urology

## 2022-10-04 ENCOUNTER — Ambulatory Visit (INDEPENDENT_AMBULATORY_CARE_PROVIDER_SITE_OTHER): Payer: Medicare Other | Admitting: Urology

## 2022-10-04 ENCOUNTER — Encounter: Payer: Self-pay | Admitting: Urology

## 2022-10-04 VITALS — BP 132/83 | HR 70 | Ht 69.0 in | Wt 189.0 lb

## 2022-10-04 DIAGNOSIS — N390 Urinary tract infection, site not specified: Secondary | ICD-10-CM | POA: Diagnosis not present

## 2022-10-04 DIAGNOSIS — N401 Enlarged prostate with lower urinary tract symptoms: Secondary | ICD-10-CM | POA: Diagnosis not present

## 2022-10-04 DIAGNOSIS — N138 Other obstructive and reflux uropathy: Secondary | ICD-10-CM

## 2022-10-04 LAB — BLADDER SCAN AMB NON-IMAGING

## 2022-10-04 MED ORDER — TAMSULOSIN HCL 0.4 MG PO CAPS: 0.4000 mg | ORAL_CAPSULE | Freq: Every day | ORAL | 11 refills | Status: DC

## 2022-10-04 NOTE — Progress Notes (Signed)
   10/04/2022 9:57 AM   Jamesetta Orleans Aug 21, 1951 [id number]  Reason for visit: Follow up BPH, recurrent UTI  HPI: 71 year old male with incomplete bladder emptying and recurrent UTI.  He was not completing the full prescribed course of antibiotics, and so this was more consistent with a persistent UTI compared to true recurrent infections.  He underwent a cystoscopy in April 2023 that showed a moderate size prostate with lateral lobe hypertrophy, small bladder diverticula, but no suspicious lesions.  Primary urinary symptoms were frequency, weak stream, nocturia 1 time per night.  Symptoms have improved significantly after starting Flomax, and PVRs have decreased, PVR normal today at 100 mL.  He really denies any urinary complaints today.  We discussed return precautions of gross hematuria, worsening urinary symptoms, recurrent urinary infections, and I recommended continuing yearly follow-up for PVR.  PSA was normal in December 2022 at 0.78, no further screening needed per guideline recommendations.  Flomax refilled, RTC 1 year PVR  Billey Co, MD  [facility] [address], [place], [place] [address] [phone]

## 2022-12-06 NOTE — Progress Notes (Unsigned)
12/07/2022 8:51 PM   Darryl Mejia 1951/08/20 [id number]  Referring provider: No referring provider defined for this encounter.  Urological history: 1. rUTI's -contributing factors of age, BPH w/ incomplete bladder emptying, pre-diabetic (5.9) -cysto (03/2022) - no nidus for infection -documented urine culture over the last year  02/24/2022 - staphylococcus aureus  12/13/2021 - staphylococcus aureus    2. BPH w/ incomplete bladder emptying -PSA (10/2022) 0.50 -cysto (03/2022) - moderate in size with lateral lobe hypertrophy - small bladder diverticula  -PVR *** -tamsulosin 0.5 mg daily   No chief complaint on file.   HPI: Darryl Mejia is a 72 y.o. male who presents today for lower abdominal pain and prostate issues.   I PSS *** UA *** PVR ***   PMH: Past Medical History:  Diagnosis Date   Hx of basal cell carcinoma 06/19/2014   Distal dorsum nose midline   Hx of basal cell carcinoma 07/19/2018   R post medial shoulder   Hypertension     Surgical History: Past Surgical History:  Procedure Laterality Date   BASAL CELL CARCINOMA EXCISION     VASECTOMY      Home Medications:  Allergies as of 12/07/2022   No Known Allergies      Medication List        Accurate as of December 06, 2022  8:51 PM. If you have any questions, ask your nurse or doctor.          aspirin EC 81 MG tablet Take 81 mg by mouth daily.   atenolol 50 MG tablet Commonly known as: TENORMIN Take 50 mg by mouth daily.   tamsulosin 0.4 MG Caps capsule Commonly known as: FLOMAX Take 1 capsule (0.4 mg total) by mouth daily after supper.        Allergies: No Known Allergies  Family History: Family History  Problem Relation Age of Onset   Healthy Mother    COPD Father     Social History:  reports that he has quit smoking. He has been exposed to tobacco smoke. He has never used smokeless tobacco. He reports that he does not drink alcohol and does not use  drugs.  ROS: Pertinent ROS in HPI  Physical Exam: There were no vitals taken for this visit.  Constitutional:  Well nourished. Alert and oriented, No acute distress. HEENT: [place] AT, moist mucus membranes.  Trachea midline, no masses. Cardiovascular: No clubbing, cyanosis, or edema. Respiratory: Normal respiratory effort, no increased work of breathing. GI: Abdomen is soft, non tender, non distended, no abdominal masses. Liver and spleen not palpable.  No hernias appreciated.  Stool sample for occult testing is not indicated.   GU: No CVA tenderness.  No bladder fullness or masses.  Patient with circumcised/uncircumcised phallus. ***Foreskin easily retracted***  Urethral meatus is patent.  No penile discharge. No penile lesions or rashes. Scrotum without lesions, cysts, rashes and/or edema.  Testicles are located scrotally bilaterally. No masses are appreciated in the testicles. Left and right epididymis are normal. Rectal: Patient with  normal sphincter tone. Anus and perineum without scarring or rashes. No rectal masses are appreciated. Prostate is approximately *** grams, *** nodules are appreciated. Seminal vesicles are normal. Skin: No rashes, bruises or suspicious lesions. Lymph: No cervical or inguinal adenopathy. Neurologic: Grossly intact, no focal deficits, moving all 4 extremities. Psychiatric: Normal mood and affect.  Laboratory Data: Serum creatinine (10/2022) 0.8 Hemoglobin A1c (10/2022) 5.9 PSA (10/2022) 0.50  Urinalysis ***  I have reviewed the  labs.  Pertinent Imaging: *** I have independently reviewed the films.    Assessment & Plan:  ***  1. BPH with LUTS -PSA stable *** -DRE benign *** -UA benign *** -PVR < 300 cc *** -symptoms - *** -most bothersome symptoms are *** -continue conservative management, avoiding bladder irritants and timed voiding's -Initiate alpha-blocker (***), discussed side effects *** -Initiate 5 alpha reductase inhibitor (***),  discussed side effects *** -Continue tamsulosin 0.4 mg daily, alfuzosin 10 mg daily, Rapaflo 8 mg daily, terazosin, doxazosin, Cialis 5 mg daily and finasteride 5 mg daily, dutasteride 0.5 mg daily***:refills given -Cannot tolerate medication or medication failure, schedule cystoscopy ***     No follow-ups on file.  These notes generated with voice recognition software. I apologize for typographical errors.  [place], [place] [address]  [place], [place] [address] [phone]

## 2022-12-07 ENCOUNTER — Ambulatory Visit
Admission: RE | Admit: 2022-12-07 | Discharge: 2022-12-07 | Disposition: A | Discharge status: Home / Self Care | Payer: Medicare Other | Attending: Urology | Admitting: Urology

## 2022-12-07 ENCOUNTER — Ambulatory Visit
Admission: RE | Admit: 2022-12-07 | Discharge: 2022-12-07 | Disposition: A | Discharge status: Home / Self Care | Payer: Medicare Other | Source: Ambulatory Visit | Attending: Urology | Admitting: Urology

## 2022-12-07 ENCOUNTER — Ambulatory Visit (INDEPENDENT_AMBULATORY_CARE_PROVIDER_SITE_OTHER): Payer: Medicare Other | Admitting: Urology

## 2022-12-07 ENCOUNTER — Encounter: Payer: Self-pay | Admitting: Urology

## 2022-12-07 VITALS — BP 135/86 | HR 82 | Ht 68.0 in | Wt 181.0 lb

## 2022-12-07 DIAGNOSIS — N138 Other obstructive and reflux uropathy: Secondary | ICD-10-CM

## 2022-12-07 DIAGNOSIS — N401 Enlarged prostate with lower urinary tract symptoms: Secondary | ICD-10-CM

## 2022-12-07 DIAGNOSIS — R198 Other specified symptoms and signs involving the digestive system and abdomen: Secondary | ICD-10-CM

## 2022-12-07 DIAGNOSIS — R3129 Other microscopic hematuria: Secondary | ICD-10-CM

## 2022-12-07 LAB — URINALYSIS, COMPLETE
Bilirubin, UA: NEGATIVE
Glucose, UA: NEGATIVE
Ketones, UA: NEGATIVE
Leukocytes,UA: NEGATIVE
Nitrite, UA: NEGATIVE
Protein,UA: NEGATIVE
Specific Gravity, UA: 1.01 (ref 1.005–1.030)
Urobilinogen, Ur: 0.2 mg/dL (ref 0.2–1.0)
pH, UA: 5.5 (ref 5.0–7.5)

## 2022-12-07 LAB — MICROSCOPIC EXAMINATION

## 2022-12-07 LAB — BLADDER SCAN AMB NON-IMAGING

## 2022-12-08 ENCOUNTER — Telehealth: Payer: Self-pay | Admitting: *Deleted

## 2022-12-08 NOTE — Telephone Encounter (Signed)
Per verbal from [place], I called to check on pt after yesterdays visit. Pt states he is doing a lot better and no pain after bowel movement

## 2022-12-09 ENCOUNTER — Telehealth: Payer: Self-pay | Admitting: Urology

## 2022-12-09 NOTE — Telephone Encounter (Signed)
Spoke to patient and he states he hasn't called our office and he has Dr. Beryle Quant his PCP is sending in the tamsulosin. He asked me to call his wife and let her know. I spoke to his wife and informed her of the information he told me. She voiced understanding.

## 2022-12-09 NOTE — Telephone Encounter (Signed)
Pt saw Larene Beach on 1/3 and forgot to mention he would need a new RX for Tamsulosin, formerly prescribed by Dr Yves Dill.  He is almost out and will need a new RX sent to [facility] in [place].

## 2022-12-11 ENCOUNTER — Other Ambulatory Visit: Payer: Self-pay | Admitting: Urology

## 2022-12-11 DIAGNOSIS — R3129 Other microscopic hematuria: Secondary | ICD-10-CM

## 2022-12-11 LAB — CULTURE, URINE COMPREHENSIVE

## 2022-12-12 ENCOUNTER — Telehealth: Payer: Self-pay | Admitting: Family Medicine

## 2022-12-12 NOTE — Telephone Encounter (Signed)
-----   Message from Nori Riis, PA-C sent at 12/11/2022  7:10 PM EST ----- Please let Mr. Ponder know that his urine culture was negative.  Because of the microscopic blood we noted in his urine sample, we need to proceed with a CT urogram to evaluate for possible stones or tumors in the kidneys.

## 2022-12-12 NOTE — Telephone Encounter (Signed)
Patient notified and voiced understanding.

## 2023-01-02 ENCOUNTER — Ambulatory Visit
Admission: RE | Admit: 2023-01-02 | Discharge: 2023-01-02 | Disposition: A | Discharge status: Home / Self Care | Payer: Medicare Other | Source: Ambulatory Visit | Attending: Urology | Admitting: Urology

## 2023-01-02 DIAGNOSIS — R3129 Other microscopic hematuria: Secondary | ICD-10-CM | POA: Diagnosis not present

## 2023-01-02 MED ORDER — IOHEXOL 300 MG/ML  SOLN
125.0000 mL | Freq: Once | INTRAMUSCULAR | Status: AC | PRN
  Administered 2023-01-02: 125 mL via INTRAVENOUS

## 2023-01-03 ENCOUNTER — Telehealth: Payer: Self-pay | Admitting: Urology

## 2023-01-03 NOTE — Telephone Encounter (Signed)
Darryl Mejia had micro heme and CT urogram found a polypoid lesion in his bladder, so we need to get him scheduled for a cysto.

## 2023-01-16 ENCOUNTER — Ambulatory Visit (INDEPENDENT_AMBULATORY_CARE_PROVIDER_SITE_OTHER): Payer: Medicare Other | Admitting: Urology

## 2023-01-16 ENCOUNTER — Ambulatory Visit: Payer: Medicare Other | Admitting: Dermatology

## 2023-01-16 ENCOUNTER — Encounter: Payer: Self-pay | Admitting: Urology

## 2023-01-16 VITALS — BP 130/85 | HR 85 | Ht 68.0 in | Wt 184.0 lb

## 2023-01-16 DIAGNOSIS — R3129 Other microscopic hematuria: Secondary | ICD-10-CM | POA: Diagnosis not present

## 2023-01-16 MED ORDER — SULFAMETHOXAZOLE-TRIMETHOPRIM 800-160 MG PO TABS
1.0000 | ORAL_TABLET | Freq: Once | ORAL | Status: AC
  Administered 2023-01-16: 1 via ORAL

## 2023-01-16 NOTE — Progress Notes (Signed)
Cystoscopy Procedure Note:  Indication: Microscopic hematuria, possible <1cm bladder polyp on CT  Bactrim given for prophylaxis  After informed consent and discussion of the procedure and its risks, Darryl Mejia was positioned and prepped in the standard fashion. Cystoscopy was performed with a flexible cystoscope. The urethra, bladder neck and entire bladder was visualized in a standard fashion. The prostate was moderate in size. The ureteral orifices were visualized in their normal location and orientation.  Bladder mucosa grossly normal throughout, no suspicious lesions, no abnormalities on retroflexion  Imaging: I personally viewed and interpreted the CT dated 01/02/2023 showing acute uncomplicated diverticulitis as well as a very subtle possible 7 mm polypoid lesion in the base of the bladder, and enlarged prostate.  He denies any abdominal pain or urinary symptoms today   Findings: Normal cystoscopy  Assessment and Plan: Continue Flomax for BPH, keep scheduled follow-up in October 2024 for PVR and symptom check  Nickolas Madrid, MD 01/16/2023

## 2023-01-30 ENCOUNTER — Ambulatory Visit (INDEPENDENT_AMBULATORY_CARE_PROVIDER_SITE_OTHER): Payer: Medicare Other | Admitting: Dermatology

## 2023-01-30 VITALS — BP 101/77

## 2023-01-30 DIAGNOSIS — Z1283 Encounter for screening for malignant neoplasm of skin: Secondary | ICD-10-CM | POA: Diagnosis not present

## 2023-01-30 DIAGNOSIS — L820 Inflamed seborrheic keratosis: Secondary | ICD-10-CM | POA: Diagnosis not present

## 2023-01-30 DIAGNOSIS — Z85828 Personal history of other malignant neoplasm of skin: Secondary | ICD-10-CM

## 2023-01-30 DIAGNOSIS — D229 Melanocytic nevi, unspecified: Secondary | ICD-10-CM

## 2023-01-30 DIAGNOSIS — L578 Other skin changes due to chronic exposure to nonionizing radiation: Secondary | ICD-10-CM

## 2023-01-30 DIAGNOSIS — L814 Other melanin hyperpigmentation: Secondary | ICD-10-CM | POA: Diagnosis not present

## 2023-01-30 DIAGNOSIS — L570 Actinic keratosis: Secondary | ICD-10-CM | POA: Diagnosis not present

## 2023-01-30 DIAGNOSIS — L821 Other seborrheic keratosis: Secondary | ICD-10-CM

## 2023-01-30 NOTE — Patient Instructions (Addendum)
 Cryotherapy Aftercare  Wash gently with soap and water everyday.   Apply Vaseline and Band-Aid daily until healed. Due to recent changes in healthcare laws, you may see results of your pathology and/or laboratory studies on MyChart before the doctors have had a chance to review them. We understand that in some cases there may be results that are confusing or concerning to you. Please understand that not all results are received at the same time and often the doctors may need to interpret multiple results in order to provide you with the best plan of care or course of treatment. Therefore, we ask that you please give us 2 business days to thoroughly review all your results before contacting the office for clarification. Should we see a critical lab result, you will be contacted sooner.   If You Need Anything After Your Visit  If you have any questions or concerns for your doctor, please call our main line at [phone] and press option 4 to reach your doctor's medical assistant. If no one answers, please leave a voicemail as directed and we will return your call as soon as possible. Messages left after 4 pm will be answered the following business day.   You may also send us a message via MyChart. We typically respond to MyChart messages within 1-2 business days.  For prescription refills, please ask your pharmacy to contact our office. Our fax number is [phone].  If you have an urgent issue when the clinic is closed that cannot wait until the next business day, you can page your doctor at the number below.    Please note that while we do our best to be available for urgent issues outside of office hours, we are not available 24/7.   If you have an urgent issue and are unable to reach us, you may choose to seek medical care at your doctor's office, retail clinic, urgent care center, or emergency room.  If you have a medical emergency, please immediately call 911 or go to the emergency  department.  Pager Numbers  - Dr. Kowalski: [phone]  - Dr. Moye: [phone]  - Dr. Stewart: [phone]  In the event of inclement weather, please call our main line at [phone] for an update on the status of any delays or closures.  Dermatology Medication Tips: Please keep the boxes that topical medications come in in order to help keep track of the instructions about where and how to use these. Pharmacies typically print the medication instructions only on the boxes and not directly on the medication tubes.   If your medication is too expensive, please contact our office at [phone] option 4 or send us a message through MyChart.   We are unable to tell what your co-pay for medications will be in advance as this is different depending on your insurance coverage. However, we may be able to find a substitute medication at lower cost or fill out paperwork to get insurance to cover a needed medication.   If a prior authorization is required to get your medication covered by your insurance company, please allow us 1-2 business days to complete this process.  Drug prices often vary depending on where the prescription is filled and some pharmacies may offer cheaper prices.  The website www.goodrx.com contains coupons for medications through different pharmacies. The prices here do not account for what the cost may be with help from insurance (it may be cheaper with your insurance), but the website can give you the   price if you did not use any insurance.  - You can print the associated coupon and take it with your prescription to the pharmacy.  - You may also stop by our office during regular business hours and pick up a GoodRx coupon card.  - If you need your prescription sent electronically to a different pharmacy, notify our office through [facility] MyChart or by phone at [phone] option 4.     Si Usted Necesita Algo Despus de Su Visita  Tambin puede enviarnos un  mensaje a travs de MyChart. Por lo general respondemos a los mensajes de MyChart en el transcurso de 1 a 2 das hbiles.  Para renovar recetas, por favor pida a su farmacia que se ponga en contacto con nuestra oficina. Nuestro nmero de fax es el [phone].  Si tiene un asunto urgente cuando la clnica est cerrada y que no puede esperar hasta el siguiente da hbil, puede llamar/localizar a su doctor(a) al nmero que aparece a continuacin.   Por favor, tenga en cuenta que aunque hacemos todo lo posible para estar disponibles para asuntos urgentes fuera del horario de oficina, no estamos disponibles las 24 horas del da, los 7 das de la semana.   Si tiene un problema urgente y no puede comunicarse con nosotros, puede optar por buscar atencin mdica  en el consultorio de su doctor(a), en una clnica privada, en un centro de atencin urgente o en una sala de emergencias.  Si tiene una emergencia mdica, por favor llame inmediatamente al 911 o vaya a la sala de emergencias.  Nmeros de bper  - Dr. Kowalski: [phone]  - Dra. Moye: [phone]  - Dra. Stewart: [phone]  En caso de inclemencias del tiempo, por favor llame a nuestra lnea principal al [phone] para una actualizacin sobre el estado de cualquier retraso o cierre.  Consejos para la medicacin en dermatologa: Por favor, guarde las cajas en las que vienen los medicamentos de uso tpico para ayudarle a seguir las instrucciones sobre dnde y cmo usarlos. Las farmacias generalmente imprimen las instrucciones del medicamento slo en las cajas y no directamente en los tubos del medicamento.   Si su medicamento es muy caro, por favor, pngase en contacto con nuestra oficina llamando al [phone] y presione la opcin 4 o envenos un mensaje a travs de MyChart.   No podemos decirle cul ser su copago por los medicamentos por adelantado ya que esto es diferente dependiendo de la cobertura de su seguro. Sin embargo,  es posible que podamos encontrar un medicamento sustituto a menor costo o llenar un formulario para que el seguro cubra el medicamento que se considera necesario.   Si se requiere una autorizacin previa para que su compaa de seguros cubra su medicamento, por favor permtanos de 1 a 2 das hbiles para completar este proceso.  Los precios de los medicamentos varan con frecuencia dependiendo del lugar de dnde se surte la receta y alguna farmacias pueden ofrecer precios ms baratos.  El sitio web www.goodrx.com tiene cupones para medicamentos de diferentes farmacias. Los precios aqu no tienen en cuenta lo que podra costar con la ayuda del seguro (puede ser ms barato con su seguro), pero el sitio web puede darle el precio si no utiliz ningn seguro.  - Puede imprimir el cupn correspondiente y llevarlo con su receta a la farmacia.  - Tambin puede pasar por nuestra oficina durante el horario de atencin regular y recoger una tarjeta de cupones de GoodRx.  - Si necesita que   su receta se enve electrnicamente a una farmacia diferente, informe a nuestra oficina a travs de MyChart de [facility] [facility] o por telfono llamando al [phone] y presione la opcin 4.  

## 2023-01-30 NOTE — Progress Notes (Signed)
Follow-Up Visit   Subjective  Darryl Mejia is a 72 y.o. male who presents for the following: Other (History of BCC - The patient presents for Total-Body Skin Exam (TBSE) for skin cancer screening and mole check.  The patient has spots, moles and lesions to be evaluated, some may be new or changing and the patient has concerns that these could be cancer./).  The following portions of the chart were reviewed this encounter and updated as appropriate:   Tobacco  Allergies  Meds  Problems  Med Hx  Surg Hx  Fam Hx     Review of Systems:  No other skin or systemic complaints except as noted in HPI or Assessment and Plan.  Objective  Well appearing patient in no apparent distress; mood and affect are within normal limits.  A full examination was performed including scalp, head, eyes, ears, nose, lips, neck, chest, axillae, abdomen, back, buttocks, bilateral upper extremities, bilateral lower extremities, hands, feet, fingers, toes, fingernails, and toenails. All findings within normal limits unless otherwise noted below.  Right post thigh x 1, right temple x 5 , left wrist x 1 (7) Erythematous stuck-on, waxy papule or plaque  Face (8) Erythematous thin papules/macules with gritty scale.    Assessment & Plan   History of Basal Cell Carcinoma of the Skin - No evidence of recurrence today - Recommend regular full body skin exams - Recommend daily broad spectrum sunscreen SPF 30+ to sun-exposed areas, reapply every 2 hours as needed.  - Call if any new or changing lesions are noted between office visits  Lentigines - Scattered tan macules - Due to sun exposure - Benign-appearing, observe - Recommend daily broad spectrum sunscreen SPF 30+ to sun-exposed areas, reapply every 2 hours as needed. - Call for any changes  Seborrheic Keratoses - Stuck-on, waxy, tan-brown papules and/or plaques  - Benign-appearing - Discussed benign etiology and prognosis. - Observe - Call for any  changes  Melanocytic Nevi - Tan-brown and/or pink-flesh-colored symmetric macules and papules - Benign appearing on exam today - Observation - Call clinic for new or changing moles - Recommend daily use of broad spectrum spf 30+ sunscreen to sun-exposed areas.   Hemangiomas - Red papules - Discussed benign nature - Observe - Call for any changes  Actinic Damage - Chronic condition, secondary to cumulative UV/sun exposure - diffuse scaly erythematous macules with underlying dyspigmentation - Recommend daily broad spectrum sunscreen SPF 30+ to sun-exposed areas, reapply every 2 hours as needed.  - Staying in the shade or wearing long sleeves, sun glasses (UVA+UVB protection) and wide brim hats (4-inch brim around the entire circumference of the hat) are also recommended for sun protection.  - Call for new or changing lesions.  Skin cancer screening performed today.  Inflamed seborrheic keratosis (7) Right post thigh x 1, right temple x 5 , left wrist x 1 Destruction of lesion - Right post thigh x 1, right temple x 5 , left wrist x 1 Complexity: simple   Destruction method: cryotherapy   Informed consent: discussed and consent obtained   Timeout:  patient name, date of birth, surgical site, and procedure verified Lesion destroyed using liquid nitrogen: Yes   Region frozen until ice ball extended beyond lesion: Yes   Outcome: patient tolerated procedure well with no complications   Post-procedure details: wound care instructions given    AK (actinic keratosis) (8) Face Destruction of lesion - Face Complexity: simple   Destruction method: cryotherapy   Informed consent:  discussed and consent obtained   Timeout:  patient name, date of birth, surgical site, and procedure verified Lesion destroyed using liquid nitrogen: Yes   Region frozen until ice ball extended beyond lesion: Yes   Outcome: patient tolerated procedure well with no complications   Post-procedure details: wound  care instructions given    Return in about 1 year (around 01/31/2024) for TBSE.  I, Ashok Cordia, CMA, am acting as scribe for Sarina Ser, MD . Documentation: I have reviewed the above documentation for accuracy and completeness, and I agree with the above.  Sarina Ser, MD

## 2023-02-07 ENCOUNTER — Encounter: Payer: Self-pay | Admitting: Dermatology

## 2023-06-15 ENCOUNTER — Inpatient Hospital Stay: Payer: Medicare Other

## 2023-06-15 NOTE — Progress Notes (Signed)
[facility] Healthcare Advance Directives Clinical Social Work  Patient presented to Advance Directives Clinic  to review and complete healthcare advance directives.  Clinical Social Worker met with patient.  The patient designated Weslie Pretlow as their primary healthcare agent and Fares Ramthun as their secondary agent.  Patient also completed healthcare living will.    Documents were notarized and copies made for patient/family. Clinical Social Worker will send documents to medical records to be scanned into patient's chart. Clinical Social Worker encouraged patient/family to contact with any additional questions or concerns.   Dorothey Baseman, LCSW Clinical Social Worker [facility]

## 2023-10-03 ENCOUNTER — Encounter: Payer: Self-pay | Admitting: Urology

## 2023-10-03 ENCOUNTER — Other Ambulatory Visit: Payer: Self-pay

## 2023-10-03 ENCOUNTER — Ambulatory Visit (INDEPENDENT_AMBULATORY_CARE_PROVIDER_SITE_OTHER): Payer: Medicare Other | Admitting: Urology

## 2023-10-03 VITALS — BP 121/77 | HR 74 | Ht 69.0 in | Wt 193.0 lb

## 2023-10-03 DIAGNOSIS — R3129 Other microscopic hematuria: Secondary | ICD-10-CM

## 2023-10-03 DIAGNOSIS — N138 Other obstructive and reflux uropathy: Secondary | ICD-10-CM

## 2023-10-03 DIAGNOSIS — N401 Enlarged prostate with lower urinary tract symptoms: Secondary | ICD-10-CM | POA: Diagnosis not present

## 2023-10-03 DIAGNOSIS — Z125 Encounter for screening for malignant neoplasm of prostate: Secondary | ICD-10-CM

## 2023-10-03 LAB — BLADDER SCAN AMB NON-IMAGING

## 2023-10-03 MED ORDER — TAMSULOSIN HCL 0.4 MG PO CAPS: 0.4000 mg | ORAL_CAPSULE | Freq: Every day | ORAL | 3 refills | Status: DC

## 2023-10-03 NOTE — Progress Notes (Signed)
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  He denies any urinary complaints today and would like to continue with Flomax.  We discussed alternatives like UroLift or HOLEP, but he is not interested in outlet procedures.  He has a history of asymptomatic microscopic hematuria with 3-10 RBC, underwent a negative microscopic hematuria workup in January 2024, prostate measures 44 g on CT at that time, I personally viewed and interpreted those images.  He also had cystoscopy February 2024 showing moderate size prostate with lateral lobe hypertrophy and no other mucosal abnormalities.  Bladder scan today , however he denies the urge to void and last void was approximately 3 hours ago.  PSA has been normal, most recently 0.5 from November 2023, reassurance provided, we also reviewed the AUA guidelines that do not recommend routine screening in men over age 35.  Flomax refilled RTC 1 year PVR  Sondra Come, MD  [facility] [address] [place], [place] [address] [phone]

## 2024-02-01 ENCOUNTER — Ambulatory Visit: Payer: Medicare Other | Admitting: Dermatology

## 2024-02-20 ENCOUNTER — Ambulatory Visit: Payer: Medicare Other | Admitting: Dermatology

## 2024-03-18 ENCOUNTER — Ambulatory Visit (INDEPENDENT_AMBULATORY_CARE_PROVIDER_SITE_OTHER): Admitting: Dermatology

## 2024-03-18 ENCOUNTER — Encounter: Payer: Self-pay | Admitting: Dermatology

## 2024-03-18 DIAGNOSIS — Z1283 Encounter for screening for malignant neoplasm of skin: Secondary | ICD-10-CM | POA: Diagnosis not present

## 2024-03-18 DIAGNOSIS — W908XXA Exposure to other nonionizing radiation, initial encounter: Secondary | ICD-10-CM | POA: Diagnosis not present

## 2024-03-18 DIAGNOSIS — L814 Other melanin hyperpigmentation: Secondary | ICD-10-CM

## 2024-03-18 DIAGNOSIS — Z85828 Personal history of other malignant neoplasm of skin: Secondary | ICD-10-CM

## 2024-03-18 DIAGNOSIS — L821 Other seborrheic keratosis: Secondary | ICD-10-CM

## 2024-03-18 DIAGNOSIS — Z872 Personal history of diseases of the skin and subcutaneous tissue: Secondary | ICD-10-CM

## 2024-03-18 DIAGNOSIS — L578 Other skin changes due to chronic exposure to nonionizing radiation: Secondary | ICD-10-CM

## 2024-03-18 DIAGNOSIS — L918 Other hypertrophic disorders of the skin: Secondary | ICD-10-CM | POA: Diagnosis not present

## 2024-03-18 DIAGNOSIS — L570 Actinic keratosis: Secondary | ICD-10-CM | POA: Diagnosis not present

## 2024-03-18 DIAGNOSIS — D1801 Hemangioma of skin and subcutaneous tissue: Secondary | ICD-10-CM

## 2024-03-18 DIAGNOSIS — D229 Melanocytic nevi, unspecified: Secondary | ICD-10-CM

## 2024-03-18 NOTE — Progress Notes (Signed)
 Follow-Up Visit   Subjective  Darryl Mejia is a 73 y.o. male who presents for the following: Skin Cancer Screening and Full Body Skin Exam. Hx of BCCs. Hx of AKs.   The patient presents for Total-Body Skin Exam (TBSE) for skin cancer screening and mole check. The patient has spots, moles and lesions to be evaluated, some may be new or changing and the patient may have concern these could be cancer.    The following portions of the chart were reviewed this encounter and updated as appropriate: medications, allergies, medical history  Review of Systems:  No other skin or systemic complaints except as noted in HPI or Assessment and Plan.  Objective  Well appearing patient in no apparent distress; mood and affect are within normal limits.  A full examination was performed including scalp, head, eyes, ears, nose, lips, neck, chest, axillae, abdomen, back, buttocks, bilateral upper extremities, bilateral lower extremities, hands, feet, fingers, toes, fingernails, and toenails. All findings within normal limits unless otherwise noted below.   Relevant physical exam findings are noted in the Assessment and Plan.  Right Preauricular Area x1 Erythematous thin papules/macules with gritty scale.  Right Axilla x3, Left axilla x7 (10) Fleshy, skin-colored pedunculated papules.    Assessment & Plan   SKIN CANCER SCREENING PERFORMED TODAY.  HISTORY OF BASAL CELL CARCINOMA OF THE SKIN - No evidence of recurrence today - Recommend regular full body skin exams - Recommend daily broad spectrum sunscreen SPF 30+ to sun-exposed areas, reapply every 2 hours as needed.  - Call if any new or changing lesions are noted between office visits   ACTINIC DAMAGE - Chronic condition, secondary to cumulative UV/sun exposure - diffuse scaly erythematous macules with underlying dyspigmentation - Recommend daily broad spectrum sunscreen SPF 30+ to sun-exposed areas, reapply every 2 hours as needed.  -  Staying in the shade or wearing long sleeves, sun glasses (UVA+UVB protection) and wide brim hats (4-inch brim around the entire circumference of the hat) are also recommended for sun protection.  - Call for new or changing lesions.  LENTIGINES, SEBORRHEIC KERATOSES, HEMANGIOMAS - Benign normal skin lesions - Benign-appearing - Call for any changes  MELANOCYTIC NEVI - Tan-brown and/or pink-flesh-colored symmetric macules and papules - Benign appearing on exam today - Observation - Call clinic for new or changing moles - Recommend daily use of broad spectrum spf 30+ sunscreen to sun-exposed areas.    LENTIGO vs other Exam: brown patch at left cheek Treatment Plan: Patient reports present since childhood and unchanged/asymptomatic. Would otherwise recommend biopsy. Call for any changes    AK (ACTINIC KERATOSIS) Right Preauricular Area x1 Actinic keratoses are precancerous spots that appear secondary to cumulative UV radiation exposure/sun exposure over time. They are chronic with expected duration over 1 year. A portion of actinic keratoses will progress to squamous cell carcinoma of the skin. It is not possible to reliably predict which spots will progress to skin cancer and so treatment is recommended to prevent development of skin cancer.  Recommend daily broad spectrum sunscreen SPF 30+ to sun-exposed areas, reapply every 2 hours as needed.  Recommend staying in the shade or wearing long sleeves, sun glasses (UVA+UVB protection) and wide brim hats (4-inch brim around the entire circumference of the hat). Call for new or changing lesions. Destruction of lesion - Right Preauricular Area x1 Complexity: simple   Destruction method: cryotherapy   Informed consent: discussed and consent obtained   Timeout:  patient name, date of birth, surgical  site, and procedure verified Lesion destroyed using liquid nitrogen: Yes   Region frozen until ice ball extended beyond lesion: Yes   Cryo  cycles: 1 or 2. Outcome: patient tolerated procedure well with no complications   Post-procedure details: wound care instructions given   Additional details:  Prior to procedure, discussed risks of blister formation, small wound, skin dyspigmentation, or rare scar following cryotherapy. Recommend Vaseline ointment to treated areas while healing.  SKIN TAG (10) Right Axilla x3, Left axilla x7 (10) Destruction of lesion - Right Axilla x3, Left axilla x7 (10) Complexity: simple   Destruction method: cryotherapy   Informed consent: discussed and consent obtained   Timeout:  patient name, date of birth, surgical site, and procedure verified Lesion destroyed using liquid nitrogen: Yes   Region frozen until ice ball extended beyond lesion: Yes   Cryo cycles: 1 or 2. Outcome: patient tolerated procedure well with no complications   Post-procedure details: wound care instructions given   MULTIPLE BENIGN NEVI   SEBORRHEIC KERATOSES   CHERRY ANGIOMA   LENTIGINES   ACTINIC ELASTOSIS   ACROCHORDON   Return in about 1 year (around 03/18/2025) for TBSE, HxBCCs, HxAKs.  I, Jill Parcell, CMA, am acting as scribe for Harris Liming, MD.   Documentation: I have reviewed the above documentation for accuracy and completeness, and I agree with the above.  Harris Liming, MD

## 2024-03-18 NOTE — Patient Instructions (Addendum)

## 2024-05-20 ENCOUNTER — Emergency Department

## 2024-05-20 ENCOUNTER — Other Ambulatory Visit: Payer: Self-pay

## 2024-05-20 ENCOUNTER — Emergency Department
Admission: EM | Admit: 2024-05-20 | Discharge: 2024-05-20 | Discharge status: Against Medical Advice | Attending: Student | Admitting: Student

## 2024-05-20 DIAGNOSIS — Z5321 Procedure and treatment not carried out due to patient leaving prior to being seen by health care provider: Secondary | ICD-10-CM | POA: Insufficient documentation

## 2024-05-20 DIAGNOSIS — Z7901 Long term (current) use of anticoagulants: Secondary | ICD-10-CM | POA: Insufficient documentation

## 2024-05-20 DIAGNOSIS — W228XXA Striking against or struck by other objects, initial encounter: Secondary | ICD-10-CM | POA: Insufficient documentation

## 2024-05-20 DIAGNOSIS — S00212A Abrasion of left eyelid and periocular area, initial encounter: Secondary | ICD-10-CM | POA: Insufficient documentation

## 2024-05-20 DIAGNOSIS — S0990XA Unspecified injury of head, initial encounter: Secondary | ICD-10-CM | POA: Diagnosis present

## 2024-05-20 NOTE — ED Provider Triage Note (Signed)
 Emergency Medicine Provider Triage Evaluation Note  Darryl Mejia , a 73 y.o. male  was evaluated in triage.  Pt complains of hitting his head with a clamp. Denies LOC, nausea, vomit, headache. Patient is taking blood thinners. Can't  remember last Tdap.  Review of Systems  Positive:  Negative:   Physical Exam  BP (!) 133/90 (BP Location: Left Arm)   Pulse 93   Temp 98 F (36.7 C) (Oral)   Resp 16   Ht 5' 9 (1.753 m)   Wt 86.2 kg   SpO2 95%   BMI 28.06 kg/m Hypertensive at triage Gen:   Awake, no distress   Resp:  Normal effort  MSK:   Moves extremities without difficulty  Other:  Left eyebrow: Abrasion of skin. Not active bleeding. See Media  Medical Decision Making  Medically screening exam initiated at 6:09 PM.  Appropriate orders placed.  [name] was informed that the remainder of the evaluation will be completed by another provider, this initial triage assessment does not replace that evaluation, and the importance of remaining in the ED until their evaluation is complete.  Patient who presents today after hitting his head. Patient is taking blood thinners. Denies LOC. Abrasion of skin in the left eyebrow, no active bleeding. CT of the head., patient needs Tdap   Awilda Lennox, PA-C 05/20/24 1813

## 2024-05-20 NOTE — ED Triage Notes (Signed)
 Pt arrived via POV with c/o a head laceration to above the left eye. Pt ran into a clamp in their shop. Pt denies losing consciousness, bleeding is controled at this time.

## 2024-09-30 ENCOUNTER — Ambulatory Visit

## 2024-09-30 DIAGNOSIS — L578 Other skin changes due to chronic exposure to nonionizing radiation: Secondary | ICD-10-CM | POA: Diagnosis not present

## 2024-09-30 DIAGNOSIS — L821 Other seborrheic keratosis: Secondary | ICD-10-CM

## 2024-09-30 DIAGNOSIS — L820 Inflamed seborrheic keratosis: Secondary | ICD-10-CM

## 2024-09-30 DIAGNOSIS — L814 Other melanin hyperpigmentation: Secondary | ICD-10-CM

## 2024-09-30 DIAGNOSIS — Z85828 Personal history of other malignant neoplasm of skin: Secondary | ICD-10-CM

## 2024-09-30 DIAGNOSIS — L570 Actinic keratosis: Secondary | ICD-10-CM

## 2024-09-30 NOTE — Patient Instructions (Addendum)

## 2024-09-30 NOTE — Progress Notes (Signed)
 Subjective   Darryl Mejia is a 73 y.o. male who presents for the following: Lesion(s) of concern . Patient is established patient   Today patient reports: Area of concern on the forehead.  Area of concern on the right thigh.  Review of Systems:    No other skin or systemic complaints except as noted in HPI or Assessment and Plan.  The following portions of the chart were reviewed this encounter and updated as appropriate: medications, allergies, medical history  Relevant Medical History:  Personal history of non melanoma skin cancer - see medical history for full details   Objective  Well appearing patient in no apparent distress; mood and affect are within normal limits. Examination was performed of the: Sun Exposed Exam: Scalp, head, eyes, ears, nose, lips, neck, upper extremities, hands, fingers, fingernails  Examination notable for: Lentigo/lentigines: Scattered pigmented macules that are tan to brown in color and are somewhat non-uniform in shape and concentrated in the sun-exposed areas, Seborrheic Keratosis(es): Stuck-on appearing keratotic papule(s) on the trunk, some  irritated with redness, crusting, edema, and/or partial avulsion, Actinic Damage/Elastosis: chronic sun damage: dyspigmentation, telangiectasia, and wrinkling, Actinic keratosis: Scaly erythematous macule(s) concentrated on sun exposed areas   Examination limited by: Clothing and Patient deferred removal     Forehead, right thigh (2) Stuck on waxy paps with erythema Face (6) Pink scaly macules  Assessment & Plan   BENIGN SKIN FINDINGS  - Lentigines  - Seborrheic keratoses - Reassurance provided regarding the benign appearance of lesions noted on exam today; no treatment is indicated in the absence of symptoms/changes. - Reinforced importance of photoprotective strategies including liberal and frequent sunscreen use of a broad-spectrum SPF 30 or greater, use of protective clothing, and sun avoidance for  prevention of cutaneous malignancy and photoaging.  Counseled patient on the importance of regular self-skin monitoring as well as routine clinical skin examinations as scheduled.   ACTINIC DAMAGE - Chronic condition, secondary to cumulative UV/sun exposure - Recommend daily broad spectrum sunscreen SPF 30+ to sun-exposed areas, reapply every 2 hours as needed.  - Staying in the shade or wearing long sleeves, sun glasses (UVA+UVB protection) and wide brim hats (4-inch brim around the entire circumference of the hat) are also recommended for sun protection.  - Call for new or changing lesions.  Personal history of non melanoma skin cancer  - Reviewed medical history for full details  - Reviewed sun protective measures as above - Encouraged full body skin exams     Level of service outlined above   Procedures, orders, diagnosis for this visit:  INFLAMED SEBORRHEIC KERATOSIS (2) Forehead, right thigh (2) Symptomatic, irritating, patient would like treated. Destruction of lesion - Forehead, right thigh (2) Complexity: simple   Destruction method: cryotherapy   Informed consent: discussed and consent obtained   Timeout:  patient name, date of birth, surgical site, and procedure verified Lesion destroyed using liquid nitrogen: Yes   Region frozen until ice ball extended beyond lesion: Yes   Cryo cycles: 1 or 2. Outcome: patient tolerated procedure well with no complications   Post-procedure details: wound care instructions given    ACTINIC KERATOSIS (6) Face (6) Actinic keratoses are precancerous spots that appear secondary to cumulative UV radiation exposure/sun exposure over time. They are chronic with expected duration over 1 year. A portion of actinic keratoses will progress to squamous cell carcinoma of the skin. It is not possible to reliably predict which spots will progress to skin cancer and so  treatment is recommended to prevent development of skin cancer.  Recommend daily broad  spectrum sunscreen SPF 30+ to sun-exposed areas, reapply every 2 hours as needed.  Recommend staying in the shade or wearing long sleeves, sun glasses (UVA+UVB protection) and wide brim hats (4-inch brim around the entire circumference of the hat). Call for new or changing lesions. Destruction of lesion - Face (6) Complexity: simple   Destruction method: cryotherapy   Informed consent: discussed and consent obtained   Timeout:  patient name, date of birth, surgical site, and procedure verified Lesion destroyed using liquid nitrogen: Yes   Region frozen until ice ball extended beyond lesion: Yes   Cryo cycles: 1 or 2. Outcome: patient tolerated procedure well with no complications   Post-procedure details: wound care instructions given     Inflamed seborrheic keratosis -     Destruction of lesion  Actinic keratosis -     Destruction of lesion    Return to clinic: No follow-ups on file.  I, Emerick Ege, CMA am acting as scribe for Lauraine JAYSON Kanaris, MD.   Documentation: I have reviewed the above documentation for accuracy and completeness, and I agree with the above.  Lauraine JAYSON Kanaris, MD

## 2024-10-01 ENCOUNTER — Ambulatory Visit (INDEPENDENT_AMBULATORY_CARE_PROVIDER_SITE_OTHER): Payer: Self-pay | Admitting: Urology

## 2024-10-01 VITALS — BP 133/82 | HR 77 | Ht 69.0 in | Wt 187.0 lb

## 2024-10-01 DIAGNOSIS — N401 Enlarged prostate with lower urinary tract symptoms: Secondary | ICD-10-CM

## 2024-10-01 DIAGNOSIS — N138 Other obstructive and reflux uropathy: Secondary | ICD-10-CM | POA: Diagnosis not present

## 2024-10-01 DIAGNOSIS — Z125 Encounter for screening for malignant neoplasm of prostate: Secondary | ICD-10-CM | POA: Diagnosis not present

## 2024-10-01 LAB — BLADDER SCAN AMB NON-IMAGING

## 2024-10-01 MED ORDER — TAMSULOSIN HCL 0.4 MG PO CAPS: 0.4000 mg | ORAL_CAPSULE | Freq: Every day | ORAL | 3 refills | Status: AC

## 2024-10-01 NOTE — Progress Notes (Signed)
   10/01/2024 9:10 AM   Darryl Mejia 1951-04-12 [id number]  Reason for visit: Follow up BPH, PSA screening, history of asymptomatic microscopic hematuria  History: BPH well-controlled on Flomax  History of low-grade microscopic hematuria January 2024 with benign cystoscopy and 44 g prostate on CT PSAs have always been <1  Physical Exam: BP 133/82 (BP Location: Left Arm, Patient Position: Sitting, Cuff Size: Normal)   Pulse 77   Ht 5' 9 (1.753 m)   Wt 187 lb (84.8 kg)   SpO2 94%   BMI 27.62 kg/m   Imaging/labs: PSA December 2024 0.59, normal renal function with eGFR greater than 60  Today: Bladder scan today , But voided a few hours ago Doing well on the Flomax , really no urinary complaints today Denies any gross hematuria, dysuria, or UTIs  Plan:   BPH: Symptoms very well-controlled on Flomax , refilled PSA screening: Reassurance provided regarding PSA less than 1, no further screening needed per the guideline recommendations Flomax  refilled, RTC 1 year IPSS, PVR   Darryl JAYSON Burnet, MD  [facility] [address], [address] [place], [place] [address] [phone]

## 2024-10-01 NOTE — Patient Instructions (Signed)
 Understanding BPH (Benign Prostatic Hyperplasia)  What is BPH? BPH stands for Benign Prostatic Hyperplasia. It is a common condition that affects many men as they get older. It happens when the prostate gland, which is part of the male reproductive system, gets bigger. This can cause problems when urinating.  What Are the Symptoms?    Feeling like you need to urinate often, especially at night Trouble starting urinating or weak stream Sudden urge to urinate Feeling that your bladder has not emptied completely  Why Does It Happen? As men age, the prostate naturally gets bigger. This can press against the tube that carries urine out of the bladder, making it hard to urinate.  How Is BPH Treated? There are different ways to treat BPH, depending on how severe the symptoms are:  Lifestyle Changes    Reduce drinks before bedtime Avoid diet/flavored drinks, caffeine, and alcohol Practice double voiding (urinating twice during a trip to the bathroom)  Medications    Alpha-blockers (like tamsulosin /flomax ) to relax the muscles in the prostate and bladder neck 5-alpha reductase inhibitors (like finasteride) to shrink the prostate  If your symptoms do not respond to medications, you have side effect from medications, or would just like to get off of medications, there are multiple surgical options to improve urination.  Procedures and Surgery(Urolift and HoLEP)    UroLift: ~15-minute outpatient procedure performed in the operating room where the prostate is pinned open with small clips.  Patient's discharge the same day, rarely needed catheter, no risk of urinary leakage or erection problems.  Very low risk of bleeding or infection.  It is common to have some temporary irritative symptoms for 1 to 2 weeks after surgery including burning with urination, urgency, frequency.  This is a good option for small prostate or patients with mild to moderate symptoms, but may not be the best long-term  treatment if you have a very large prostate, have urinary retention requiring a catheter, or have severe symptoms  HoLEP(holmium laser enucleation of the prostate): ~1 hour outpatient procedure performed in the operating room where a laser is used to hollow out a large channel through the prostate.  The procedure was performed through the urethra, so there are no cuts or incisions.  You will need a catheter for 2 days afterwards.  Low risk of bleeding, infection, or damage to the bladder or ureters.  This is the gold standard BPH procedure.  Common to have urinary urgency, frequency, and leakage temporarily afterwards, but <2% risk of long-term incontinence.  This is the best treatment if you have a very large prostate, retention requiring a Foley catheter, or have severe symptoms.

## 2025-03-18 ENCOUNTER — Ambulatory Visit: Admitting: Dermatology

## 2025-10-02 ENCOUNTER — Ambulatory Visit: Admitting: Urology
